# Patient Record
Sex: Female | Born: 1975
Health system: Southern US, Community
[De-identification: ages and names within clinical notes are randomized; demographics above are authoritative.]

## PROBLEM LIST (undated history)

## (undated) DIAGNOSIS — E7800 Pure hypercholesterolemia, unspecified: Secondary | ICD-10-CM

## (undated) DIAGNOSIS — G4730 Sleep apnea, unspecified: Secondary | ICD-10-CM

## (undated) DIAGNOSIS — R011 Cardiac murmur, unspecified: Secondary | ICD-10-CM

## (undated) DIAGNOSIS — T7840XA Allergy, unspecified, initial encounter: Secondary | ICD-10-CM

## (undated) DIAGNOSIS — E538 Deficiency of other specified B group vitamins: Secondary | ICD-10-CM

## (undated) DIAGNOSIS — R87619 Unspecified abnormal cytological findings in specimens from cervix uteri: Secondary | ICD-10-CM

## (undated) DIAGNOSIS — D1800 Hemangioma unspecified site: Secondary | ICD-10-CM

## (undated) DIAGNOSIS — K760 Fatty (change of) liver, not elsewhere classified: Secondary | ICD-10-CM

## (undated) DIAGNOSIS — J309 Allergic rhinitis, unspecified: Secondary | ICD-10-CM

## (undated) DIAGNOSIS — I10 Essential (primary) hypertension: Secondary | ICD-10-CM

## (undated) DIAGNOSIS — M1990 Unspecified osteoarthritis, unspecified site: Secondary | ICD-10-CM

## (undated) DIAGNOSIS — Z87442 Personal history of urinary calculi: Secondary | ICD-10-CM

## (undated) DIAGNOSIS — Z91018 Allergy to other foods: Secondary | ICD-10-CM

## (undated) DIAGNOSIS — J189 Pneumonia, unspecified organism: Secondary | ICD-10-CM

## (undated) DIAGNOSIS — E739 Lactose intolerance, unspecified: Secondary | ICD-10-CM

## (undated) HISTORY — DX: Deficiency of other specified B group vitamins: E53.8

## (undated) HISTORY — DX: Allergic rhinitis, unspecified: J30.9

## (undated) HISTORY — DX: Unspecified abnormal cytological findings in specimens from cervix uteri: R87.619

## (undated) HISTORY — PX: HIP ARTHROSCOPY: SUR88

## (undated) HISTORY — DX: Allergy, unspecified, initial encounter: T78.40XA

## (undated) HISTORY — DX: Essential (primary) hypertension: I10

## (undated) HISTORY — DX: Pure hypercholesterolemia, unspecified: E78.00

## (undated) HISTORY — PX: WISDOM TOOTH EXTRACTION: SHX21

## (undated) HISTORY — DX: Cardiac murmur, unspecified: R01.1

## (undated) HISTORY — DX: Lactose intolerance, unspecified: E73.9

## (undated) HISTORY — DX: Allergy to other foods: Z91.018

## (undated) HISTORY — DX: Sleep apnea, unspecified: G47.30

## (undated) HISTORY — DX: Unspecified osteoarthritis, unspecified site: M19.90

## (undated) HISTORY — DX: Fatty (change of) liver, not elsewhere classified: K76.0

---

## 2013-10-26 ENCOUNTER — Ambulatory Visit (INDEPENDENT_AMBULATORY_CARE_PROVIDER_SITE_OTHER): Payer: BC Managed Care – PPO | Admitting: Internal Medicine

## 2013-10-26 ENCOUNTER — Ambulatory Visit: Payer: Self-pay | Admitting: Family Medicine

## 2013-10-26 ENCOUNTER — Encounter: Payer: Self-pay | Admitting: Internal Medicine

## 2013-10-26 VITALS — BP 138/70 | HR 93 | Temp 99.6°F | Resp 12 | Wt 152.8 lb

## 2013-10-26 DIAGNOSIS — J111 Influenza due to unidentified influenza virus with other respiratory manifestations: Secondary | ICD-10-CM

## 2013-10-26 MED ORDER — OSELTAMIVIR PHOSPHATE 75 MG PO CAPS: 75.0000 mg | ORAL_CAPSULE | Freq: Two times a day (BID) | ORAL | Status: DC

## 2013-10-26 MED ORDER — CLARITHROMYCIN ER 500 MG PO TB24: 1000.0000 mg | ORAL_TABLET | Freq: Every day | ORAL | Status: DC

## 2013-10-26 NOTE — Patient Instructions (Addendum)
Plain Mucinex (NOT D) for thick secretions ;force NON dairy fluids .   Nasal cleansing in the shower as discussed with lather of mild shampoo.After 10 seconds wash off lather while  exhaling through nostrils. Make sure that all residual soap is removed to prevent irritation.  Fluticasone 1 spray in each nostril twice a day as needed. Use the "crossover" technique into opposite nostril spraying toward opposite ear @ 45 degree angle, not straight up into nostril.  Use a Neti pot daily only  as needed for significant sinus congestion; going from open side to congested side . Plain Allegra (NOT D )  160 daily , Loratidine 10 mg , OR Zyrtec 10 mg @ bedtime  as needed for itchy eyes & sneezing. Fill the  prescription for antibiotic it there is not dramatic improvement in the next 72 hours.

## 2013-10-26 NOTE — Progress Notes (Signed)
Pre visit review using our clinic review tool, if applicable. No additional management support is needed unless otherwise documented below in the visit note. 

## 2013-10-26 NOTE — Progress Notes (Signed)
   Subjective:    Patient ID: Jessica Richmond, female    DOB: Apr 27, 1976, 38 y.o.   MRN: [id number]  HPI   Symptoms started 10/24/13 as significant rhinitis, head congestion, nonproductive cough. As of 3/9 she was experiencing itchy, watery eyes, and sneezing. She also had fever, chills, sweats.  She has been exposed to work associates who were ill.  She's been taking nonsteroidal agents.  At this time her significant symptoms include frontal headache, dry cough, fever, chills, sweats, the extrinsic symptoms noted above, and muscle and joint pains.  She does have seasonal allergies but no history of asthma. She's never smoked. No flu shot.       Review of Systems  She specifically denies maxillary sinus pain, dental pain, sore throat, discolored nasal secretions, otic pain, or otic discharge. The cough is not associated with shortness of breath or wheezing.     Objective:   Physical Exam  General appearance:good health ;well nourished; no acute distress or increased work of breathing is present.  No  lymphadenopathy about the head, neck, or axilla noted.   Eyes: No conjunctival inflammation or lid edema is present. There is no scleral icterus.  Ears:  External ear exam shows no significant lesions or deformities. Otoscopic examination reveals clear canals, tympanic membranes are intact bilaterally without bulging, retraction, inflammation or discharge.  Nose:  External nasal examination shows no deformity or inflammation. Nasal mucosa are erythematous without lesions or exudates. R septal  deviation.No obstruction to airflow.   Oral exam: Dental hygiene is good; lips and gums are healthy appearing.There is no oropharyngeal erythema or exudate noted.   Neck:  No deformities, masses, or tenderness noted.   Heart:  Normal rate and regular rhythm. S1 and S2 normal without gallop, murmur, click, rub or other extra sounds.   Lungs:Chest clear to auscultation; no wheezes, rhonchi,rales  ,or rubs present.No increased work of breathing.    Extremities:  No cyanosis, edema, or clubbing  noted    Skin: Warm & dry         Assessment & Plan:  #1 flu/viral respiratory syndrome  Criteria for rhinosinusitis is not present.  See recommendations

## 2013-11-01 ENCOUNTER — Telehealth: Payer: Self-pay | Admitting: Internal Medicine

## 2013-11-01 NOTE — Telephone Encounter (Signed)
Patient Information:  Caller Name: Joei  Phone: [phone]  Patient: Jessica Richmond, Jessica Richmond  Gender: Female  DOB: 1976/04/26  Age: 38 Years  PCP: Unice Cobble  Pregnant: No  Office Follow Up:  Does the office need to follow up with this patient?: No  Instructions For The Office: N/A   Symptoms  Reason For Call & Symptoms: Seen in office over 1 week ago  for flu-like sx and put on Tamiflu. Started with Ear pain on 10/30/13 sx -were mild dizziness, ringing and pain in R ear. Took Advil 600 mgs PO every 6-8 hours and helped with ear pain. Last took Advil at 2200 10/31/13 and today sx better.  Reviewed Health History In EMR: Yes  Reviewed Medications In EMR: Yes  Reviewed Allergies In EMR: Yes  Reviewed Surgeries / Procedures: Yes  Date of Onset of Symptoms: 10/30/2013  Treatments Tried: Advil 600mg s PO  Treatments Tried Worked: No OB / GYN:  LMP: 10/18/2013  Guideline(s) Used:  Earache  Disposition Per Guideline:   Home Care  Reason For Disposition Reached:   Earache < 60 minutes duration that is now completely gone  Advice Given:  Pain Medicines:  For pain relief, you can take either acetaminophen, ibuprofen, or naproxen.  They are over-the-counter (OTC) pain drugs. You can buy them at the drugstore.  Contagiousness:  Ear infections are not contagious.  Call Back If  Earache last more than 1 hour  High fever, severe headache, or stiff neck occurs  You become worse.  Patient Will Follow Care Advice:  YES

## 2013-11-04 ENCOUNTER — Encounter: Payer: Self-pay | Admitting: Family Medicine

## 2013-11-04 ENCOUNTER — Ambulatory Visit (INDEPENDENT_AMBULATORY_CARE_PROVIDER_SITE_OTHER): Payer: BC Managed Care – PPO | Admitting: Family Medicine

## 2013-11-04 ENCOUNTER — Telehealth: Payer: Self-pay | Admitting: Physician Assistant

## 2013-11-04 VITALS — BP 134/90 | HR 74 | Temp 97.7°F | Wt 152.0 lb

## 2013-11-04 DIAGNOSIS — H6640 Suppurative otitis media, unspecified, unspecified ear: Secondary | ICD-10-CM

## 2013-11-04 MED ORDER — FLUCONAZOLE 150 MG PO TABS
150.0000 mg | ORAL_TABLET | Freq: Once | ORAL | Status: DC
Start: 2013-11-04 — End: 2014-03-28

## 2013-11-04 MED ORDER — AZITHROMYCIN 250 MG PO TABS: ORAL_TABLET | ORAL | Status: AC

## 2013-11-04 NOTE — Telephone Encounter (Signed)
Patient Information:  Caller Name: Embrie  Phone: [phone]  Patient: Jessica Richmond, Jessica Richmond  Gender: Female  DOB: September 27, 1975  Age: 38 Years  PCP: Unice Cobble  Pregnant: No  Office Follow Up:  Does the office need to follow up with this patient?: No  Instructions For The Office: N/A  RN Note:  Seen 10/26/13 for influenza.  Took Tamiflu; did not use Rx for Biaxin. Nasal congestion and cough subsided. Ear pain rated 3/10; jaw pain 4-5/10. No appointments remain at [facility] office.  Scheduled for 1630 11/04/13 with Dr Elease Hashimoto at [facility].    Symptoms  Reason For Call & Symptoms: Recurrent right ear ache, jaw pain and dizziness.  Had ear ache 10/29/13 and called for triage 11/01/13 but ear symptoms were resolving so treatment deferred.  Reviewed Health History In EMR: Yes  Reviewed Medications In EMR: Yes  Reviewed Allergies In EMR: Yes  Reviewed Surgeries / Procedures: Yes  Date of Onset of Symptoms: 11/04/2013  Treatments Tried: Advil  Treatments Tried Worked: Yes OB / GYN:  LMP: 10/18/2013  Guideline(s) Used:  Earache  Disposition Per Guideline:   See Today in Office  Reason For Disposition Reached:   All other earaches (Exceptions: earache lasting < 1 hour, and earache from air travel)  Advice Given:  Pain Medicines:  For pain relief, you can take either acetaminophen, ibuprofen, or naproxen.  Ibuprofen (e.g., Motrin, Advil):  Take 400 mg (two 200 mg pills) by mouth every 6 hours.  Another choice is to take 600 mg (three 200 mg pills) by mouth every 8 hours.  The most you should take each day is 1,200 mg (six 200 mg pills), unless your doctor has told you to take more.  Apply Cold to the Area for Pain:  Apply a cold pack or a cold wet washcloth to the outer ear for 20 minutes to reduce pain while the pain medicine takes effect (Note: Some individuals prefer local heat instead of cold for 20 minutes).  Call Back If  High fever, severe headache, or stiff neck occurs  You become  worse.  Patient Will Follow Care Advice:  YES  Appointment Scheduled:  11/04/2013 16:30:00 Appointment Scheduled Provider:  Other

## 2013-11-04 NOTE — Progress Notes (Signed)
Pre visit review using our clinic review tool, if applicable. No additional management support is needed unless otherwise documented below in the visit note. 

## 2013-11-04 NOTE — Progress Notes (Signed)
   Subjective:    Patient ID: Jessica Richmond, female    DOB: 05-23-1976, 38 y.o.   MRN: [id number]  Otalgia  Pertinent negatives include no coughing, ear discharge, headaches or sore throat.   Patient seen a little over week ago with presumed viral syndrome. She had low-grade fever, nasal congestion, cough, body aches. She was seen and prescribed Tamiflu. She now presents with right ear pain for the past week. No decreased hearing. Minimal dizziness off and on. No recurrent fever. No alleviating factors. She was prescribed Biaxin at visit over one week ago but never filled this prescription. Patient is a nonsmoker. She has reported allergy to Keflex with hives.  No past medical history on file. No past surgical history on file.  reports that she has never smoked. She does not have any smokeless tobacco history on file. She reports that she drinks alcohol. She reports that she does not use illicit drugs. family history is not on file. Allergies  Allergen Reactions  . Keflex [Cephalexin] Hives    hives  . Indomethacin Nausea Only    Dizziness      Review of Systems  Constitutional: Negative for fever and chills.  HENT: Positive for ear pain. Negative for ear discharge and sore throat.   Respiratory: Negative for cough.   Neurological: Negative for headaches.       Objective:   Physical Exam  Constitutional: She appears well-developed and well-nourished.  HENT:  Left Ear: External ear normal.  Mouth/Throat: Oropharynx is clear and moist.  Patient has some early suppurative type changes along the inferior portion right eardrum. Minimal erythema.  Neck: Neck supple.  Cardiovascular: Normal rate and regular rhythm.   Pulmonary/Chest: Effort normal and breath sounds normal. No respiratory distress. She has no wheezes. She has no rales.  Lymphadenopathy:    She has no cervical adenopathy.          Assessment & Plan:  Recent viral syndrome. Patient now presents with early  suppurative changes right eardrum. Allergic to Keflex. Start Zithromax for 5 days. Followup if symptoms persist

## 2013-11-04 NOTE — Patient Instructions (Signed)
Otitis Media With Effusion Otitis media with effusion is the presence of fluid in the middle ear. This is a common problem in children, which often follows ear infections. It may be present for weeks or longer after the infection. Unlike an acute ear infection, otitis media with effusion refers only to fluid behind the ear drum and not infection. Children with repeated ear and sinus infections and allergy problems are the most likely to get otitis media with effusion. CAUSES  The most frequent cause of the fluid buildup is dysfunction of the eustachian tubes. These are the tubes that drain fluid in the ears to the to the back of the nose (nasopharynx). SYMPTOMS   The main symptom of this condition is hearing loss. As a result, you or your child may:  Listen to the TV at a loud volume.  Not respond to questions.  Ask "what" often when spoken to.  Mistake or confuse on sound or word for another.  There may be a sensation of fullness or pressure but usually not pain. DIAGNOSIS   Your health care provider will diagnose this condition by examining you or your child's ears.  Your health care provider may test the pressure in you or your child's ear with a tympanometer.  A hearing test may be conducted if the problem persists. TREATMENT   Treatment depends on the duration and the effects of the effusion.  Antibiotics, decongestants, nose drops, and cortisone-type drugs (tablets or nasal spray) may not be helpful.  Children with persistent ear effusions may have delayed language or behavioral problems. Children at risk for developmental delays in hearing, learning, and speech may require referral to a specialist earlier than children not at risk.  You or your child's health care provider may suggest a referral to an ear, nose, and throat surgeon for treatment. The following may help restore normal hearing:  Drainage of fluid.  Placement of ear tubes (tympanostomy tubes).  Removal of  adenoids (adenoidectomy). HOME CARE INSTRUCTIONS   Avoid second hand smoke.  Infants who are breast fed are less likely to have this condition.  Avoid feeding infants while laying flat.  Avoid known environmental allergens.  Avoid people who are sick. SEEK MEDICAL CARE IF:   Hearing is not better in 3 months.  Hearing is worse.  Ear pain.  Drainage from the ear.  Dizziness. MAKE SURE YOU:   Understand these instructions.  Will watch your condition.  Will get help right away if you are not doing well or get worse. Document Released: 09/12/2004 Document Revised: 05/26/2013 Document Reviewed: 03/02/2013 [employer] Patient Information 2014 [employer].  

## 2013-11-26 ENCOUNTER — Ambulatory Visit: Payer: Self-pay | Admitting: Physician Assistant

## 2013-12-06 ENCOUNTER — Ambulatory Visit (INDEPENDENT_AMBULATORY_CARE_PROVIDER_SITE_OTHER): Payer: BC Managed Care – PPO | Admitting: Internal Medicine

## 2013-12-06 ENCOUNTER — Other Ambulatory Visit (INDEPENDENT_AMBULATORY_CARE_PROVIDER_SITE_OTHER): Payer: BC Managed Care – PPO

## 2013-12-06 ENCOUNTER — Encounter: Payer: Self-pay | Admitting: Internal Medicine

## 2013-12-06 VITALS — BP 138/90 | HR 76 | Temp 97.8°F | Resp 16 | Ht 61.0 in | Wt 147.0 lb

## 2013-12-06 DIAGNOSIS — R142 Eructation: Secondary | ICD-10-CM

## 2013-12-06 DIAGNOSIS — R143 Flatulence: Secondary | ICD-10-CM

## 2013-12-06 DIAGNOSIS — R141 Gas pain: Secondary | ICD-10-CM

## 2013-12-06 DIAGNOSIS — J309 Allergic rhinitis, unspecified: Secondary | ICD-10-CM

## 2013-12-06 DIAGNOSIS — Z23 Encounter for immunization: Secondary | ICD-10-CM

## 2013-12-06 DIAGNOSIS — R140 Abdominal distension (gaseous): Secondary | ICD-10-CM

## 2013-12-06 LAB — BASIC METABOLIC PANEL
BUN: 13 mg/dL (ref 6–23)
CALCIUM: 9.4 mg/dL (ref 8.4–10.5)
CHLORIDE: 104 meq/L (ref 96–112)
CO2: 26 mEq/L (ref 19–32)
CREATININE: 0.7 mg/dL (ref 0.4–1.2)
GFR: 98.62 mL/min (ref >60.00)
Glucose, Bld: 90 mg/dL (ref 70–99)
Potassium: 4.1 mEq/L (ref 3.5–5.1)
Sodium: 137 mEq/L (ref 135–145)

## 2013-12-06 LAB — CBC WITH DIFFERENTIAL/PLATELET
Basophils Absolute: 0.1 10*3/uL (ref 0.0–0.1)
Basophils Relative: 1 % (ref 0.0–3.0)
Eosinophils Absolute: 0 10*3/uL (ref 0.0–0.7)
Eosinophils Relative: 0.8 % (ref 0.0–5.0)
HCT: 40 % (ref 36.0–46.0)
Hemoglobin: 13.4 g/dL (ref 12.0–15.0)
LYMPHS PCT: 35.7 % (ref 12.0–46.0)
Lymphs Abs: 2.1 10*3/uL (ref 0.7–4.0)
MCHC: 33.4 g/dL (ref 30.0–36.0)
MCV: 88.1 fl (ref 78.0–100.0)
MONO ABS: 0.2 10*3/uL (ref 0.1–1.0)
Monocytes Relative: 2.8 % — ABNORMAL LOW (ref 3.0–12.0)
NEUTROS PCT: 59.7 % (ref 43.0–77.0)
Neutro Abs: 3.6 10*3/uL (ref 1.4–7.7)
PLATELETS: 285 10*3/uL (ref 150.0–400.0)
RBC: 4.53 Mil/uL (ref 3.87–5.11)
RDW: 12.5 % (ref 11.5–14.6)
WBC: 5.9 10*3/uL (ref 4.5–10.5)

## 2013-12-06 LAB — URINALYSIS
BILIRUBIN URINE: NEGATIVE
KETONES UR: NEGATIVE
LEUKOCYTES UA: NEGATIVE
Nitrite: NEGATIVE
SPECIFIC GRAVITY, URINE: 1.01 (ref 1.000–1.030)
Total Protein, Urine: NEGATIVE
Urine Glucose: NEGATIVE
Urobilinogen, UA: 0.2 (ref 0.0–1.0)
pH: 6 (ref 5.0–8.0)

## 2013-12-06 LAB — TSH: TSH: 1.93 u[IU]/mL (ref 0.35–5.50)

## 2013-12-06 LAB — HEPATIC FUNCTION PANEL
ALT: 22 U/L (ref 0–35)
AST: 29 U/L (ref 0–37)
Albumin: 4 g/dL (ref 3.5–5.2)
Alkaline Phosphatase: 70 U/L (ref 39–117)
BILIRUBIN DIRECT: 0.1 mg/dL (ref 0.0–0.3)
BILIRUBIN TOTAL: 1 mg/dL (ref 0.3–1.2)
Total Protein: 7.3 g/dL (ref 6.0–8.3)

## 2013-12-06 LAB — LIPID PANEL
CHOL/HDL RATIO: 4
CHOLESTEROL: 220 mg/dL — AB (ref 0–200)
HDL: 59.9 mg/dL (ref >39.00)
LDL CALC: 134 mg/dL — AB (ref 0–99)
Triglycerides: 129 mg/dL (ref 0.0–149.0)
VLDL: 25.8 mg/dL (ref 0.0–40.0)

## 2013-12-06 LAB — VITAMIN B12: VITAMIN B 12: 165 pg/mL — AB (ref 211–911)

## 2013-12-06 LAB — H. PYLORI ANTIBODY, IGG: H Pylori IgG: NEGATIVE

## 2013-12-06 MED ORDER — VITAMIN D 1000 UNITS PO TABS: 1000.0000 [IU] | ORAL_TABLET | Freq: Every day | ORAL | Status: AC

## 2013-12-06 NOTE — Assessment & Plan Note (Addendum)
Labs  Claritin qd

## 2013-12-06 NOTE — Assessment & Plan Note (Addendum)
Chronic  Labs Off dairy and gluten Allergy IgG, IgE test

## 2013-12-06 NOTE — Progress Notes (Signed)
Pre visit review using our clinic review tool, if applicable. No additional management support is needed unless otherwise documented below in the visit note. 

## 2013-12-06 NOTE — Patient Instructions (Signed)
Gluten free trial (no wheat products) for 4-6 weeks. OK to use gluten-free bread and gluten-free pasta.     

## 2013-12-06 NOTE — Progress Notes (Signed)
   Subjective:    Patient ID: Jessica Richmond, female    DOB: December 16, 1975, 38 y.o.   MRN: [id number]  Abdominal Pain This is a chronic problem. The current episode started more than 1 year ago. The onset quality is undetermined. The problem occurs every several days. The problem has been unchanged. The pain is located in the generalized abdominal region. The pain is moderate. The quality of the pain is cramping and a sensation of fullness. The abdominal pain does not radiate. Associated symptoms include constipation and nausea. Pertinent negatives include no anorexia, dysuria, fever, frequency, hematochezia, melena, vomiting or weight loss. The pain is aggravated by eating. There is no history of Crohn's disease or ulcerative colitis.   Better off dairy and off gluten C/o allergies  Wt Readings from Last 3 Encounters:  12/06/13 147 lb (66.679 kg)  11/04/13 152 lb (68.947 kg)  10/26/13 152 lb 12.8 oz (69.31 kg)     Review of Systems  Constitutional: Negative for fever and weight loss.  HENT: Positive for postnasal drip, rhinorrhea and sneezing.   Gastrointestinal: Positive for nausea, abdominal pain and constipation. Negative for vomiting, melena, hematochezia and anorexia.  Genitourinary: Negative for dysuria and frequency.       Objective:   Physical Exam  Constitutional: She appears well-developed. No distress.  HENT:  Head: Normocephalic.  Right Ear: External ear normal.  Left Ear: External ear normal.  Nose: Nose normal.  Mouth/Throat: Oropharynx is clear and moist.  Eyes: Conjunctivae are normal. Pupils are equal, round, and reactive to light. Right eye exhibits no discharge. Left eye exhibits no discharge.  Neck: Normal range of motion. Neck supple. No JVD present. No tracheal deviation present. No thyromegaly present.  Cardiovascular: Normal rate, regular rhythm and normal heart sounds.   Pulmonary/Chest: No stridor. No respiratory distress. She has no wheezes.  Abdominal:  Soft. Bowel sounds are normal. She exhibits no distension and no mass. There is no tenderness. There is no rebound and no guarding.  Musculoskeletal: She exhibits no edema and no tenderness.  Lymphadenopathy:    She has no cervical adenopathy.  Neurological: She displays normal reflexes. No cranial nerve deficit. She exhibits normal muscle tone. Coordination normal.  Skin: No rash noted. No erythema.  Psychiatric: She has a normal mood and affect. Her behavior is normal. Judgment and thought content normal.    Lab Results  Component Value Date   WBC 5.9 12/06/2013   HGB 13.4 12/06/2013   HCT 40.0 12/06/2013   PLT 285.0 12/06/2013   GLUCOSE 90 12/06/2013   CHOL 220* 12/06/2013   TRIG 129.0 12/06/2013   HDL 59.90 12/06/2013   LDLCALC 134* 12/06/2013   ALT 22 12/06/2013   AST 29 12/06/2013   NA 137 12/06/2013   K 4.1 12/06/2013   CL 104 12/06/2013   CREATININE 0.7 12/06/2013   BUN 13 12/06/2013   CO2 26 12/06/2013   TSH 1.93 12/06/2013         Assessment & Plan:

## 2013-12-07 LAB — ALLERGY PROFILE REGION II-DC, DE, MD, ~~LOC~~, VA
ALLERGEN, D PTERNOYSSINUS, D1: 0.18 kU/L — AB
Alternaria Alternata: 0.11 kU/L — ABNORMAL HIGH
Bermuda Grass: <0.1 kU/L
Box Elder IgE: <0.1 kU/L
CAT DANDER: 0.17 kU/L — AB
Cockroach: 0.76 kU/L — ABNORMAL HIGH
Common Ragweed: 0.47 kU/L — ABNORMAL HIGH
D. FARINAE: 0.13 kU/L — AB
Dog Dander: <0.1 kU/L
Johnson Grass: <0.1 kU/L
Lamb's Quarters: <0.1 kU/L
Meadow Grass: <0.1 kU/L
Pecan/Hickory Tree IgE: <0.1 kU/L

## 2013-12-07 LAB — ALLERGEN FOOD PROFILE SPECIFIC IGE
Apple: <0.1 kU/L
Chicken IgE: <0.1 kU/L
Egg White IgE: <0.1 kU/L
Fish Cod: <0.1 kU/L
IgE (Immunoglobulin E), Serum: 35.4 IU/mL (ref 0.0–180.0)
Milk IgE: <0.1 kU/L
Orange: <0.1 kU/L
Peanut IgE: <0.1 kU/L
Shrimp IgE: 0.88 kU/L — ABNORMAL HIGH
Tomato IgE: <0.1 kU/L
Tuna IgE: <0.1 kU/L

## 2013-12-07 LAB — RETICULIN ANTIBODIES, IGA W TITER: RETICULIN AB, IGA: NEGATIVE

## 2013-12-10 LAB — IGG FOOD PANEL
ALLERGEN CORN IGG: 0.18 ug/mL — AB (ref <0.15)
Allergen, Milk, IgG: 14.4 ug/mL — ABNORMAL HIGH (ref <0.15)
Beef, IgG: 7.1 ug/mL — ABNORMAL HIGH (ref <2.0)
Chicken, IgG: <0.15 ug/mL (ref <0.15)
Egg white, IgG: 3.7 ug/mL — ABNORMAL HIGH (ref <2.0)
Egg yolk, IgG: 2.3 ug/mL — ABNORMAL HIGH (ref <2.0)
Peanut, IgG: 0.27 ug/mL — ABNORMAL HIGH (ref <0.15)
Wheat, IgG: 0.71 ug/mL — ABNORMAL HIGH (ref <0.15)

## 2013-12-14 LAB — GLIADIN ANTIBODIES, SERUM: GLIADIN IGG: 21.1 U/mL — AB (ref <20)

## 2013-12-14 LAB — TISSUE TRANSGLUTAMINASE, IGA: TISSUE TRANSGLUTAMINASE AB, IGA: 5.8 U/mL (ref <20)

## 2013-12-27 ENCOUNTER — Ambulatory Visit (INDEPENDENT_AMBULATORY_CARE_PROVIDER_SITE_OTHER): Payer: BC Managed Care – PPO | Admitting: Internal Medicine

## 2013-12-27 ENCOUNTER — Encounter: Payer: Self-pay | Admitting: Internal Medicine

## 2013-12-27 VITALS — BP 136/88 | HR 79 | Temp 98.2°F | Ht 61.0 in | Wt 146.8 lb

## 2013-12-27 DIAGNOSIS — J309 Allergic rhinitis, unspecified: Secondary | ICD-10-CM

## 2013-12-27 DIAGNOSIS — R141 Gas pain: Secondary | ICD-10-CM

## 2013-12-27 DIAGNOSIS — R142 Eructation: Secondary | ICD-10-CM

## 2013-12-27 DIAGNOSIS — R140 Abdominal distension (gaseous): Secondary | ICD-10-CM

## 2013-12-27 DIAGNOSIS — E538 Deficiency of other specified B group vitamins: Secondary | ICD-10-CM | POA: Insufficient documentation

## 2013-12-27 DIAGNOSIS — R143 Flatulence: Secondary | ICD-10-CM

## 2013-12-27 MED ORDER — CYANOCOBALAMIN 1000 MCG/ML IJ SOLN
1000.0000 ug | Freq: Once | INTRAMUSCULAR | Status: AC
  Administered 2013-12-27: 1000 ug via INTRAMUSCULAR

## 2013-12-27 MED ORDER — CYANOCOBALAMIN 2000 MCG PO TABS: 2000.0000 ug | ORAL_TABLET | Freq: Every day | ORAL | Status: DC

## 2013-12-27 NOTE — Progress Notes (Signed)
Pre visit review using our clinic review tool, if applicable. No additional management support is needed unless otherwise documented below in the visit note. 

## 2013-12-27 NOTE — Assessment & Plan Note (Signed)
2015 Start Rx GI ref Dr Henrene Pastor

## 2014-01-03 ENCOUNTER — Encounter: Payer: Self-pay | Admitting: Internal Medicine

## 2014-01-03 NOTE — Assessment & Plan Note (Signed)
Allergies discussed Claritin daily

## 2014-01-03 NOTE — Progress Notes (Signed)
   Subjective:   F/u Vit B12 def  Abdominal Pain This is a chronic problem. The current episode started more than 1 year ago. The onset quality is undetermined. The problem occurs every several days. The problem has been unchanged. The pain is located in the generalized abdominal region. The pain is moderate. The quality of the pain is cramping and a sensation of fullness. The abdominal pain does not radiate. Associated symptoms include constipation and nausea. Pertinent negatives include no anorexia, dysuria, fever, frequency, hematochezia, melena, vomiting or weight loss. The pain is aggravated by eating. There is no history of Crohn's disease or ulcerative colitis.   Better off dairy and off gluten F/u allergies  Wt Readings from Last 3 Encounters:  12/27/13 146 lb 12 oz (66.565 kg)  12/06/13 147 lb (66.679 kg)  11/04/13 152 lb (68.947 kg)     Review of Systems  Constitutional: Negative for fever and weight loss.  HENT: Positive for postnasal drip, rhinorrhea and sneezing.   Gastrointestinal: Positive for nausea, abdominal pain and constipation. Negative for vomiting, melena, hematochezia and anorexia.  Genitourinary: Negative for dysuria and frequency.       Objective:   Physical Exam  Constitutional: She appears well-developed. No distress.  HENT:  Head: Normocephalic.  Right Ear: External ear normal.  Left Ear: External ear normal.  Nose: Nose normal.  Mouth/Throat: Oropharynx is clear and moist.  Eyes: Conjunctivae are normal. Pupils are equal, round, and reactive to light. Right eye exhibits no discharge. Left eye exhibits no discharge.  Neck: Normal range of motion. Neck supple. No JVD present. No tracheal deviation present. No thyromegaly present.  Cardiovascular: Normal rate, regular rhythm and normal heart sounds.   Pulmonary/Chest: No stridor. No respiratory distress. She has no wheezes.  Abdominal: Soft. Bowel sounds are normal. She exhibits no distension and no  mass. There is no tenderness. There is no rebound and no guarding.  Musculoskeletal: She exhibits no edema and no tenderness.  Lymphadenopathy:    She has no cervical adenopathy.  Neurological: She displays normal reflexes. No cranial nerve deficit. She exhibits normal muscle tone. Coordination normal.  Skin: No rash noted. No erythema.  Psychiatric: She has a normal mood and affect. Her behavior is normal. Judgment and thought content normal.    Lab Results  Component Value Date   WBC 5.9 12/06/2013   HGB 13.4 12/06/2013   HCT 40.0 12/06/2013   PLT 285.0 12/06/2013   GLUCOSE 90 12/06/2013   CHOL 220* 12/06/2013   TRIG 129.0 12/06/2013   HDL 59.90 12/06/2013   LDLCALC 134* 12/06/2013   ALT 22 12/06/2013   AST 29 12/06/2013   NA 137 12/06/2013   K 4.1 12/06/2013   CL 104 12/06/2013   CREATININE 0.7 12/06/2013   BUN 13 12/06/2013   CO2 26 12/06/2013   TSH 1.93 12/06/2013         Assessment & Plan:

## 2014-01-03 NOTE — Assessment & Plan Note (Signed)
Chronic  Off dairy and gluten

## 2014-03-07 ENCOUNTER — Ambulatory Visit (INDEPENDENT_AMBULATORY_CARE_PROVIDER_SITE_OTHER): Payer: BC Managed Care – PPO | Admitting: Internal Medicine

## 2014-03-07 ENCOUNTER — Encounter: Payer: Self-pay | Admitting: Internal Medicine

## 2014-03-07 ENCOUNTER — Ambulatory Visit: Payer: BC Managed Care – PPO | Admitting: Internal Medicine

## 2014-03-07 VITALS — BP 124/90 | HR 92 | Ht 61.5 in | Wt 150.5 lb

## 2014-03-07 DIAGNOSIS — R1084 Generalized abdominal pain: Secondary | ICD-10-CM

## 2014-03-07 DIAGNOSIS — R142 Eructation: Secondary | ICD-10-CM

## 2014-03-07 DIAGNOSIS — E538 Deficiency of other specified B group vitamins: Secondary | ICD-10-CM

## 2014-03-07 DIAGNOSIS — R141 Gas pain: Secondary | ICD-10-CM

## 2014-03-07 DIAGNOSIS — K5900 Constipation, unspecified: Secondary | ICD-10-CM

## 2014-03-07 DIAGNOSIS — R143 Flatulence: Secondary | ICD-10-CM

## 2014-03-07 MED ORDER — MOVIPREP 100 G PO SOLR: 1.0000 | Freq: Once | ORAL | Status: DC

## 2014-03-07 NOTE — Progress Notes (Signed)
HISTORY OF PRESENT ILLNESS:  Jessica Richmond is a 38 y.o. female , new to [place], who is referred regarding chronic abdominal complaints and B12 deficiency. Patient has had long-standing abdominal complaints which she attributed to lactose intolerance. Over the past 6-7 months she has had significant problems with postprandial bloating, occasional abdominal pain, and a tendency toward constipation (not requiring laxatives). There has been no fevers, bleeding, or weight loss. Actually, slight weight gain. She mentions fatigue. She was evaluated by Dr. Alain Marion a few months ago. Blood work revealed B12 deficiency. CBC was normal as was TSH and comprehensive metabolic panel. Gliadin IgG antibody was positive with negative tissue transglutaminase antibody. Food allergy panel was normal except for reactive the with shrimp IgE. She reports that her sister may have had some form of "colitis" but has no details. Father with diverticulosis. No GI malignancy. Patient denies rash. No joint aches. She is on sublingual B12 replacement  REVIEW OF SYSTEMS:  All non-GI ROS negative except for sinus and allergy, fatigue  Past Medical History  Diagnosis Date  . Vitamin B 12 deficiency   . Allergic rhinitis   . Multiple food allergies     wheat, Milk, shrimp    Past Surgical History  Procedure Laterality Date  . Hip arthroscopy Left     torn labrumin    Social History Ceriah Tinch  reports that she has never smoked. She has never used smokeless tobacco. She reports that she drinks alcohol. She reports that she does not use illicit drugs.  family history includes Breast cancer in her mother; Crohn's disease in her sister; Diabetes in her father; Diverticulosis in her father; Heart disease (age of onset: 75) in her mother; Hypertension in her father; Uterine cancer in her other.  Allergies  Allergen Reactions  . Keflex [Cephalexin] Hives    hives  . Indomethacin Nausea Only    Dizziness  .  Milk-Related Compounds   . Shrimp [Shellfish Allergy]   . Wheat Bran        PHYSICAL EXAMINATION: Vital signs: BP 124/90  Pulse 92  Ht 5' 1.5" (1.562 m)  Wt 150 lb 8 oz (68.266 kg)  BMI 27.98 kg/m2  LMP 02/21/2014  Constitutional: generally well-appearing, no acute distress Psychiatric: alert and oriented x3, cooperative Eyes: extraocular movements intact, anicteric, conjunctiva pink Mouth: oral pharynx moist, no lesions Neck: supple no lymphadenopathy Cardiovascular: heart regular rate and rhythm, no murmur Lungs: clear to auscultation bilaterally Abdomen: soft, nontender, nondistended, no obvious ascites, no peritoneal signs, normal bowel sounds, no organomegaly Rectal: Deferred until colonoscopy Extremities: no lower extremity edema bilaterally Skin: no lesions on visible extremities Neuro: No focal deficits. No asterixis.    ASSESSMENT:  #1. Chronic abdominal complaints and B12 deficiency. Now on B12 replacement. Equivocal celiac testing. Rule out antibodies to intrinsic factor, occult duodenal or ileal disease.   PLAN:  #1. Anti-intrinsic factor antibodies. To be obtained with followup blood work in a few weeks #2. Upper endoscopy with duodenal biopsies and colonoscopy with ileal intubation.The nature of the procedure, as well as the risks, benefits, and alternatives were carefully and thoroughly reviewed with the patient. Ample time for discussion and questions allowed. The patient understood, was satisfied, and agreed to proceed. Movi prep prescribed. The patient instructed on its use #3. If the above negative, consider an empiric course of broad-spectrum antibiotics for possible bacterial overgrowth

## 2014-03-07 NOTE — Patient Instructions (Signed)
  I added an anti intrinsic antibody lab to your existing lab order for next week.  You have been scheduled for an endoscopy and colonoscopy. Please follow the written instructions given to you at your visit today. Please pick up your prep at the pharmacy within the next 1-3 days. If you use inhalers (even only as needed), please bring them with you on the day of your procedure. Your physician has requested that you go to www.startemmi.com and enter the access code given to you at your visit today. This web site gives a general overview about your procedure. However, you should still follow specific instructions given to you by our office regarding your preparation for the procedure.

## 2014-03-08 ENCOUNTER — Encounter: Payer: Self-pay | Admitting: Internal Medicine

## 2014-03-22 ENCOUNTER — Other Ambulatory Visit (INDEPENDENT_AMBULATORY_CARE_PROVIDER_SITE_OTHER): Payer: BC Managed Care – PPO

## 2014-03-22 ENCOUNTER — Other Ambulatory Visit: Payer: BC Managed Care – PPO

## 2014-03-22 DIAGNOSIS — E538 Deficiency of other specified B group vitamins: Secondary | ICD-10-CM

## 2014-03-22 DIAGNOSIS — K5900 Constipation, unspecified: Secondary | ICD-10-CM

## 2014-03-22 DIAGNOSIS — R143 Flatulence: Secondary | ICD-10-CM

## 2014-03-22 DIAGNOSIS — R141 Gas pain: Secondary | ICD-10-CM

## 2014-03-22 DIAGNOSIS — R142 Eructation: Secondary | ICD-10-CM

## 2014-03-22 DIAGNOSIS — R1084 Generalized abdominal pain: Secondary | ICD-10-CM

## 2014-03-22 LAB — VITAMIN B12: Vitamin B-12: 1298 pg/mL — ABNORMAL HIGH (ref 211–911)

## 2014-03-23 LAB — INTRINSIC FACTOR ANTIBODIES: INTRINSIC FACTOR: NEGATIVE

## 2014-03-23 LAB — VITAMIN D 25 HYDROXY (VIT D DEFICIENCY, FRACTURES): VIT D 25 HYDROXY: 56 ng/mL (ref 30–89)

## 2014-03-28 ENCOUNTER — Encounter: Payer: Self-pay | Admitting: Internal Medicine

## 2014-03-28 ENCOUNTER — Ambulatory Visit (INDEPENDENT_AMBULATORY_CARE_PROVIDER_SITE_OTHER): Payer: BC Managed Care – PPO | Admitting: Internal Medicine

## 2014-03-28 VITALS — BP 147/88 | HR 89 | Temp 97.6°F | Wt 153.0 lb

## 2014-03-28 DIAGNOSIS — J309 Allergic rhinitis, unspecified: Secondary | ICD-10-CM

## 2014-03-28 DIAGNOSIS — R142 Eructation: Secondary | ICD-10-CM

## 2014-03-28 DIAGNOSIS — R030 Elevated blood-pressure reading, without diagnosis of hypertension: Secondary | ICD-10-CM | POA: Insufficient documentation

## 2014-03-28 DIAGNOSIS — R141 Gas pain: Secondary | ICD-10-CM

## 2014-03-28 DIAGNOSIS — IMO0001 Reserved for inherently not codable concepts without codable children: Secondary | ICD-10-CM

## 2014-03-28 DIAGNOSIS — R143 Flatulence: Secondary | ICD-10-CM

## 2014-03-28 DIAGNOSIS — R140 Abdominal distension (gaseous): Secondary | ICD-10-CM

## 2014-03-28 DIAGNOSIS — E538 Deficiency of other specified B group vitamins: Secondary | ICD-10-CM

## 2014-03-28 NOTE — Assessment & Plan Note (Signed)
Continue with current prescription therapy as reflected on the Med list.  

## 2014-03-28 NOTE — Progress Notes (Signed)
   Subjective:   F/u Vit B12 def, elev BP due to stress   Abdominal Pain This is a chronic problem. The current episode started more than 1 year ago. The onset quality is undetermined. The problem occurs every several days. The problem has been gradually improving. The pain is located in the generalized abdominal region. The pain is mild. The quality of the pain is cramping and a sensation of fullness. The abdominal pain does not radiate. Associated symptoms include constipation and nausea. Pertinent negatives include no anorexia, dysuria, fever, frequency, hematochezia, melena, vomiting or weight loss. The pain is aggravated by eating. There is no history of Crohn's disease or ulcerative colitis.   Better off dairy and off gluten F/u allergies  Wt Readings from Last 3 Encounters:  03/28/14 153 lb (69.4 kg)  03/07/14 150 lb 8 oz (68.266 kg)  12/27/13 146 lb 12 oz (66.565 kg)     Review of Systems  Constitutional: Negative for fever and weight loss.  HENT: Positive for postnasal drip, rhinorrhea and sneezing.   Gastrointestinal: Positive for nausea, abdominal pain and constipation. Negative for vomiting, melena, hematochezia and anorexia.  Genitourinary: Negative for dysuria and frequency.       Objective:   Physical Exam  Constitutional: She appears well-developed. No distress.  HENT:  Head: Normocephalic.  Right Ear: External ear normal.  Left Ear: External ear normal.  Nose: Nose normal.  Mouth/Throat: Oropharynx is clear and moist.  Eyes: Conjunctivae are normal. Pupils are equal, round, and reactive to light. Right eye exhibits no discharge. Left eye exhibits no discharge.  Neck: Normal range of motion. Neck supple. No JVD present. No tracheal deviation present. No thyromegaly present.  Cardiovascular: Normal rate, regular rhythm and normal heart sounds.   Pulmonary/Chest: No stridor. No respiratory distress. She has no wheezes.  Abdominal: Soft. Bowel sounds are normal.  She exhibits no distension and no mass. There is no tenderness. There is no rebound and no guarding.  Musculoskeletal: She exhibits no edema and no tenderness.  Lymphadenopathy:    She has no cervical adenopathy.  Neurological: She displays normal reflexes. No cranial nerve deficit. She exhibits normal muscle tone. Coordination normal.  Skin: No rash noted. No erythema.  Psychiatric: She has a normal mood and affect. Her behavior is normal. Judgment and thought content normal.    Lab Results  Component Value Date   WBC 5.9 12/06/2013   HGB 13.4 12/06/2013   HCT 40.0 12/06/2013   PLT 285.0 12/06/2013   GLUCOSE 90 12/06/2013   CHOL 220* 12/06/2013   TRIG 129.0 12/06/2013   HDL 59.90 12/06/2013   LDLCALC 134* 12/06/2013   ALT 22 12/06/2013   AST 29 12/06/2013   NA 137 12/06/2013   K 4.1 12/06/2013   CL 104 12/06/2013   CREATININE 0.7 12/06/2013   BUN 13 12/06/2013   CO2 26 12/06/2013   TSH 1.93 12/06/2013         Assessment & Plan:

## 2014-03-28 NOTE — Assessment & Plan Note (Signed)
Monitor BP 

## 2014-03-28 NOTE — Assessment & Plan Note (Addendum)
Change from 2000 mcg/d to 1000 mcg B12/d sl

## 2014-03-28 NOTE — Progress Notes (Deleted)
Pre visit review using our clinic review tool, if applicable. No additional management support is needed unless otherwise documented below in the visit note. 

## 2014-05-10 ENCOUNTER — Encounter: Payer: BC Managed Care – PPO | Admitting: Internal Medicine

## 2014-05-26 ENCOUNTER — Encounter: Payer: Self-pay | Admitting: Obstetrics & Gynecology

## 2014-05-26 ENCOUNTER — Ambulatory Visit (INDEPENDENT_AMBULATORY_CARE_PROVIDER_SITE_OTHER): Payer: BC Managed Care – PPO | Admitting: Obstetrics & Gynecology

## 2014-05-26 VITALS — BP 135/84 | HR 84 | Resp 16 | Ht 61.5 in | Wt 150.0 lb

## 2014-05-26 DIAGNOSIS — Z124 Encounter for screening for malignant neoplasm of cervix: Secondary | ICD-10-CM

## 2014-05-26 DIAGNOSIS — Z1151 Encounter for screening for human papillomavirus (HPV): Secondary | ICD-10-CM | POA: Diagnosis not present

## 2014-05-26 DIAGNOSIS — Z01419 Encounter for gynecological examination (general) (routine) without abnormal findings: Secondary | ICD-10-CM | POA: Diagnosis not present

## 2014-05-26 MED ORDER — NORGESTIMATE-ETH ESTRADIOL 0.25-35 MG-MCG PO TABS
1.0000 | ORAL_TABLET | Freq: Every day | ORAL | Status: DC
Start: 2014-05-26 — End: 2015-06-05

## 2014-05-26 NOTE — Progress Notes (Signed)
  Subjective:     Jessica Richmond is a 38 y.o. female here for a routine exam.  Current complaints: none.  Pt is on OCPs and has one menses a month.  Would like to continue.  Personal health questionnaire reviewed: yes.  Pt recently moved to area.  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 Contraception: OCP (estrogen/progesterone) Last Pap: history of ASCUS 15+ years ago, all normal since Last mammogram: never has had a mammogram.  Obstetric History OB History  Gravida Para Term Preterm AB SAB TAB Ectopic Multiple Living  _0         The following portions of the patient's history were reviewed and updated as appropriate: allergies, current medications, past family history, past medical history, past social history, past surgical history and problem list.  Review of Systems Pertinent items are noted in HPI.    Objective:      Filed Vitals:   05/26/14 1323  BP: 135/84  Pulse: 84  Resp: 16  Height: 5' 1.5" (1.562 m)  Weight: 150 lb (68.04 kg)   Vitals:  WNL General appearance: alert, cooperative and no distress Head: Normocephalic, without obvious abnormality, atraumatic Eyes: negative Throat: lips, mucosa, and tongue normal; teeth and gums normal Lungs: clear to auscultation bilaterally Breasts: normal appearance, no masses or tenderness, No nipple retraction or dimpling, No nipple discharge or bleeding Heart: regular rate and rhythm Abdomen: soft, non-tender; bowel sounds normal; no masses,  no organomegaly  Pelvic:  External Genitalia:  Tanner V, no lesion Urethra:  No prolapse Vagina:  Pink, normal rugae, no blood or discharge Cervix:  No CMT, no lesion Uterus:  Normal size and contour, non tender Adnexa:  Normal, no masses, non tender  Extremities: no edema, redness or tenderness in the calves or thighs Skin: no lesions or rash Lymph nodes: Axillary  adenopathy: none        Assessment:    Healthy female exam.    Plan:    Education reviewed: self breast exams and skin cancer screening. Contraception: OCP (estrogen/progesterone). Mammogram ordered. Follow up in: 1 year. Mother has history of postmenopausal breast cancer.  No other relatives with BRCA type cancers.

## 2014-05-31 LAB — CYTOLOGY - PAP

## 2014-06-08 ENCOUNTER — Ambulatory Visit (INDEPENDENT_AMBULATORY_CARE_PROVIDER_SITE_OTHER): Payer: BC Managed Care – PPO

## 2014-06-08 DIAGNOSIS — Z01419 Encounter for gynecological examination (general) (routine) without abnormal findings: Secondary | ICD-10-CM

## 2014-06-08 DIAGNOSIS — Z1231 Encounter for screening mammogram for malignant neoplasm of breast: Secondary | ICD-10-CM

## 2014-07-07 ENCOUNTER — Encounter: Payer: Self-pay | Admitting: Family

## 2014-07-07 ENCOUNTER — Ambulatory Visit (INDEPENDENT_AMBULATORY_CARE_PROVIDER_SITE_OTHER): Payer: BC Managed Care – PPO | Admitting: Family

## 2014-07-07 ENCOUNTER — Telehealth: Payer: Self-pay | Admitting: Internal Medicine

## 2014-07-07 VITALS — BP 140/86 | HR 75 | Temp 98.3°F | Resp 18 | Ht 61.0 in | Wt 155.8 lb

## 2014-07-07 DIAGNOSIS — N3090 Cystitis, unspecified without hematuria: Secondary | ICD-10-CM | POA: Insufficient documentation

## 2014-07-07 DIAGNOSIS — R300 Dysuria: Secondary | ICD-10-CM

## 2014-07-07 LAB — POCT URINALYSIS DIPSTICK
Bilirubin, UA: NEGATIVE
Blood, UA: NEGATIVE
Glucose, UA: NEGATIVE
Ketones, UA: NEGATIVE
Nitrite, UA: NEGATIVE
PH UA: 6
PROTEIN UA: NEGATIVE
Spec Grav, UA: 1.015
Urobilinogen, UA: NEGATIVE

## 2014-07-07 MED ORDER — SULFAMETHOXAZOLE-TRIMETHOPRIM 800-160 MG PO TABS: 1.0000 | ORAL_TABLET | Freq: Two times a day (BID) | ORAL | Status: DC

## 2014-07-07 NOTE — Patient Instructions (Signed)
Thank you for choosing [facility].  Summary/Instructions:  Your prescription(s) have been submitted to your pharmacy. Please take as directed and contact our office if you believe you are having problem(s) with the medication(s).  If your symptoms worsen or fail to improve, please contact our office for further instruction, or in case of emergency go directly to the emergency room at the closest medical facility.   Urinary Tract Infection Urinary tract infections (UTIs) can develop anywhere along your urinary tract. Your urinary tract is your body's drainage system for removing wastes and extra water. Your urinary tract includes two kidneys, two ureters, a bladder, and a urethra. Your kidneys are a pair of bean-shaped organs. Each kidney is about the size of your fist. They are located below your ribs, one on each side of your spine. CAUSES Infections are caused by microbes, which are microscopic organisms, including fungi, viruses, and bacteria. These organisms are so small that they can only be seen through a microscope. Bacteria are the microbes that most commonly cause UTIs. SYMPTOMS  Symptoms of UTIs may vary by age and gender of the patient and by the location of the infection. Symptoms in young women typically include a frequent and intense urge to urinate and a painful, burning feeling in the bladder or urethra during urination. Older women and men are more likely to be tired, shaky, and weak and have muscle aches and abdominal pain. A fever may mean the infection is in your kidneys. Other symptoms of a kidney infection include pain in your back or sides below the ribs, nausea, and vomiting. DIAGNOSIS To diagnose a UTI, your caregiver will ask you about your symptoms. Your caregiver also will ask to provide a urine sample. The urine sample will be tested for bacteria and white blood cells. White blood cells are made by your body to help fight infection. TREATMENT  Typically, UTIs can  be treated with medication. Because most UTIs are caused by a bacterial infection, they usually can be treated with the use of antibiotics. The choice of antibiotic and length of treatment depend on your symptoms and the type of bacteria causing your infection. HOME CARE INSTRUCTIONS  If you were prescribed antibiotics, take them exactly as your caregiver instructs you. Finish the medication even if you feel better after you have only taken some of the medication.  Drink enough water and fluids to keep your urine clear or pale yellow.  Avoid caffeine, tea, and carbonated beverages. They tend to irritate your bladder.  Empty your bladder often. Avoid holding urine for long periods of time.  Empty your bladder before and after sexual intercourse.  After a bowel movement, women should cleanse from front to back. Use each tissue only once. SEEK MEDICAL CARE IF:   You have back pain.  You develop a fever.  Your symptoms do not begin to resolve within 3 days. SEEK IMMEDIATE MEDICAL CARE IF:   You have severe back pain or lower abdominal pain.  You develop chills.  You have nausea or vomiting.  You have continued burning or discomfort with urination. MAKE SURE YOU:   Understand these instructions.  Will watch your condition.  Will get help right away if you are not doing well or get worse. Document Released: 05/15/2005 Document Revised: 02/04/2012 Document Reviewed: 09/13/2011 ExitCare Patient Information 2015 [employer]. This information is not intended to replace advice given to you by your health care provider. Make sure you discuss any questions you have with your health   care provider.   

## 2014-07-07 NOTE — Progress Notes (Signed)
   Subjective:    Patient ID: Jessica Richmond, female    DOB: 1976/06/20, 38 y.o.   MRN: [id number]  Chief Complaint  Patient presents with  . Dysuria    x5 days on and off with urgency    HPI:  Jessica Richmond is a 38 y.o. female who presents today for an acute visit.   Acute symptoms of painful urination started about 5 days ago. Dysuria has been waxing and waning. Urgency and burning, but not consistently. Denies fevers or chills or back pain. Denies any antibiotic use recently.   Allergies  Allergen Reactions  . Keflex [Cephalexin] Hives    hives  . Indomethacin Nausea Only    Dizziness  . Milk-Related Compounds   . Shrimp [Shellfish Allergy]   . Wheat Bran    Current Outpatient Prescriptions on File Prior to Visit  Medication Sig Dispense Refill  . cholecalciferol (VITAMIN D) 1000 UNITS tablet Take 1 tablet (1,000 Units total) by mouth daily. 100 tablet 3  . cyanocobalamin ([facility] VITAMIN B12) 2000 MCG tablet Take 1 tablet (2,000 mcg total) by mouth daily. 100 tablet 3  . EPIPEN 2-PAK 0.3 MG/0.3ML SOAJ injection     . fluticasone (FLONASE) 50 MCG/ACT nasal spray Place 1 spray into both nostrils daily.    Marland Kitchen loratadine (CLARITIN) 10 MG tablet Take 10 mg by mouth daily.    . norgestimate-ethinyl estradiol (ORTHO-CYCLEN, 28,) 0.25-35 MG-MCG tablet Take 1 tablet by mouth daily. 1 Package 12   No current facility-administered medications on file prior to visit.    Review of Systems    See HPI  Objective:    BP 140/86 mmHg  Pulse 75  Temp(Src) 98.3 F (36.8 C) (Oral)  Resp 18  Ht 5\' 1"  (1.549 m)  Wt 155 lb 12.8 oz (70.67 kg)  BMI 29.45 kg/m2  SpO2 99% Nursing note and vital signs reviewed.  Physical Exam  Constitutional: She is oriented to person, place, and time. She appears well-developed and well-nourished. No distress.  Cardiovascular: Normal rate, regular rhythm, normal heart sounds and intact distal pulses.   Pulmonary/Chest: Effort normal and breath sounds  normal.  Abdominal: There is no CVA tenderness.  Neurological: She is alert and oriented to person, place, and time.  Skin: Skin is warm and dry.  Psychiatric: She has a normal mood and affect. Her behavior is normal. Judgment and thought content normal.       Assessment & Plan:

## 2014-07-07 NOTE — Progress Notes (Signed)
Pre visit review using our clinic review tool, if applicable. No additional management support is needed unless otherwise documented below in the visit note. 

## 2014-07-07 NOTE — Assessment & Plan Note (Addendum)
Symptoms consistent with cystitis. POCT UA is positive for leukocytes, negative for nitrites. Start Bactrim x 3 days. Drink plenty of fluids. Follow up if symptoms worsen or fail to improve.

## 2014-07-07 NOTE — Telephone Encounter (Signed)
Patient Information:  Caller Name: Rossi  Phone: [phone]  Patient: Jessica Richmond, Jessica Richmond  Gender: Female  DOB: 1976-03-17  Age: 38 Years  PCP: Plotnikov, Alex (Adults only)  Pregnant: No  Office Follow Up:  Does the office need to follow up with this patient?: No  Instructions For The Office: N/A   Symptoms  Reason For Call & Symptoms: 07/02/14 took Azo due to urinary urgency and burning which helped.    07/07/14 urinary burning and urgency have returned.   No blood in urine.   Increased fluids.  Rates pain with urination 4/10.   Afebrile.  Reviewed Health History In EMR: Yes  Reviewed Medications In EMR: Yes  Reviewed Allergies In EMR: Yes  Reviewed Surgeries / Procedures: Yes  Date of Onset of Symptoms: 07/02/2014 OB / GYN:  LMP: 06/16/2014  Guideline(s) Used:  Urination Pain - Female  Disposition Per Guideline:   See Today in Office  Reason For Disposition Reached:   Painful urination AND EITHER frequency or urgency  Advice Given:  Fluids:   Drink extra fluids. Drink 8-10 glasses of liquids a day (Reason: to produce a dilute, non-irritating urine).  Warm Saline SITZ Baths to Reduce Pain:  Sit in a warm saline bath for 20 minutes to cleanse the area and to reduce pain. Add 2 oz. of table salt or baking soda to a tub of water.  Call Back If:  You become worse.  Patient Will Follow Care Advice:  YES  Appointment Scheduled:  07/07/2014 14:15:00 Appointment Scheduled Provider:  Mauricio Po

## 2015-06-05 ENCOUNTER — Telehealth: Payer: Self-pay | Admitting: *Deleted

## 2015-06-05 DIAGNOSIS — Z760 Encounter for issue of repeat prescription: Secondary | ICD-10-CM

## 2015-06-05 MED ORDER — NORGESTIMATE-ETH ESTRADIOL 0.25-35 MG-MCG PO TABS
1.0000 | ORAL_TABLET | Freq: Every day | ORAL | Status: DC
Start: 2015-06-05 — End: 2015-06-13

## 2015-06-05 NOTE — Telephone Encounter (Signed)
Refilled 1 pack to hold until next appt

## 2015-06-13 ENCOUNTER — Ambulatory Visit (INDEPENDENT_AMBULATORY_CARE_PROVIDER_SITE_OTHER): Payer: BLUE CROSS/BLUE SHIELD | Admitting: Obstetrics & Gynecology

## 2015-06-13 ENCOUNTER — Encounter: Payer: Self-pay | Admitting: Obstetrics & Gynecology

## 2015-06-13 VITALS — BP 135/91 | HR 80 | Resp 16 | Ht 61.0 in | Wt 156.0 lb

## 2015-06-13 DIAGNOSIS — Z23 Encounter for immunization: Secondary | ICD-10-CM | POA: Diagnosis not present

## 2015-06-13 DIAGNOSIS — Z760 Encounter for issue of repeat prescription: Secondary | ICD-10-CM

## 2015-06-13 DIAGNOSIS — IMO0001 Reserved for inherently not codable concepts without codable children: Secondary | ICD-10-CM

## 2015-06-13 DIAGNOSIS — R030 Elevated blood-pressure reading, without diagnosis of hypertension: Secondary | ICD-10-CM

## 2015-06-13 DIAGNOSIS — Z01419 Encounter for gynecological examination (general) (routine) without abnormal findings: Secondary | ICD-10-CM

## 2015-06-13 DIAGNOSIS — Z3041 Encounter for surveillance of contraceptive pills: Secondary | ICD-10-CM | POA: Diagnosis not present

## 2015-06-13 MED ORDER — NORGESTIMATE-ETH ESTRADIOL 0.25-35 MG-MCG PO TABS: 1.0000 | ORAL_TABLET | Freq: Every day | ORAL | Status: DC

## 2015-06-13 NOTE — Addendum Note (Signed)
Addended by: Asencion Islam on: 06/13/2015 03:23 PM   Modules accepted: Orders

## 2015-06-13 NOTE — Progress Notes (Signed)
  Subjective:     Jessica Richmond is a 39 y.o. female here for a routine exam.  Current complaints: increased BP when she at the MD.  She has home cuff and all are nml.     Gynecologic History Patient's last menstrual period was 06/06/2015. Contraception: OCP (estrogen/progesterone) Last Pap: 2015. Results were: normal Last mammogram: 2015. Results were: normal  Obstetric History OB History  Gravida Para Term Preterm AB SAB TAB Ectopic Multiple Living  0 0 0 0 0 0 0 0 0 0        The following portions of the patient's history were reviewed and updated as appropriate: allergies, current medications, past family history, past medical history, past social history, past surgical history and problem list.  Review of Systems Pertinent items noted in HPI and remainder of comprehensive ROS otherwise negative.    Objective:      Filed Vitals:   06/13/15 1452  BP: 135/91  Pulse: 80  Resp: 16  Height: 5\' 1"  (1.549 m)  Weight: 156 lb (70.761 kg)   Vitals:  WNL General appearance: alert, cooperative and no distress  HEENT: Normocephalic, without obvious abnormality, atraumatic Eyes: negative Throat: lips, mucosa, and tongue normal; teeth and gums normal  Respiratory: Clear to auscultation bilaterally  CV: Regular rate and rhythm  Breasts:  Normal appearance, no masses or tenderness, no nipple retraction or dimpling  GI: Soft, non-tender; bowel sounds normal; no masses,  no organomegaly  GU: External Genitalia:  Tanner V, no lesion Urethra:  No prolapse   Vagina: Pink, normal rugae, no blood or discharge  Cervix: No CMT, no lesion  Uterus:  Normal size and contour, non tender  Adnexa: Normal, no masses, non tender  Musculoskeletal: No edema, redness or tenderness in the calves or thighs  Skin: No lesions or rash  Lymphatic: Axillary adenopathy: none    Psychiatric: Normal mood and behavior    Assessment:    Healthy female exam.    Plan:    Education reviewed: self breast  exams and skin cancer screening. Contraception: OCP (estrogen/progesterone). Mammogram ordered. Follow up in: 1 year.   Mother had mastectomy in her 19s.  Pt do get more detail MGGM--uterine cancer   Patient to send [place] home BPs OCPs refilled

## 2015-07-24 ENCOUNTER — Other Ambulatory Visit: Payer: Self-pay | Admitting: Obstetrics & Gynecology

## 2015-07-24 DIAGNOSIS — Z1231 Encounter for screening mammogram for malignant neoplasm of breast: Secondary | ICD-10-CM

## 2015-08-03 ENCOUNTER — Ambulatory Visit (INDEPENDENT_AMBULATORY_CARE_PROVIDER_SITE_OTHER): Payer: BLUE CROSS/BLUE SHIELD

## 2015-08-03 DIAGNOSIS — Z1231 Encounter for screening mammogram for malignant neoplasm of breast: Secondary | ICD-10-CM

## 2016-03-12 ENCOUNTER — Ambulatory Visit (INDEPENDENT_AMBULATORY_CARE_PROVIDER_SITE_OTHER): Payer: BLUE CROSS/BLUE SHIELD | Admitting: Family

## 2016-03-12 ENCOUNTER — Encounter: Payer: Self-pay | Admitting: Family

## 2016-03-12 DIAGNOSIS — R42 Dizziness and giddiness: Secondary | ICD-10-CM | POA: Insufficient documentation

## 2016-03-12 MED ORDER — MECLIZINE HCL 25 MG PO TABS: 25.0000 mg | ORAL_TABLET | Freq: Three times a day (TID) | ORAL | 1 refills | Status: DC | PRN

## 2016-03-12 NOTE — Progress Notes (Signed)
Subjective:    Patient ID: Jessica Richmond, female    DOB: 1976/08/04, 40 y.o.   MRN: [id number]  Chief Complaint  Patient presents with  . Dizziness    starting thursday had an episode of vertigo which she states has never happened before, happened again on saturday and now feels pressure in her head behind eyes and dizziness    HPI:  Jessica Richmond is a 40 y.o. female who  has a past medical history of Abnormal Pap smear of cervix; Allergic rhinitis; Multiple food allergies; and Vitamin B 12 deficiency. and presents today for an office visit.   This is a new problem. Associated symptom of dizziness has been going on for about 5 days. Symptoms generally wax and wane lasting a couple of minutes at a time. Has not had an episode in the last couple of days. Described as a combination of the room spinning and occasional lightheadedness. Does have about 16oz of caffeine daily and drinks water remainder of the day. No fevers or congestion. Denies head trauma or changes in vision. Denies any attempted treatments or modifying factors. No previous history of vertigo.    Allergies  Allergen Reactions  . Keflex [Cephalexin] Hives    hives  . Indomethacin Nausea Only    Dizziness  . Milk-Related Compounds   . Shrimp [Shellfish Allergy]   . Wheat Bran      Current Outpatient Prescriptions on File Prior to Visit  Medication Sig Dispense Refill  . cyanocobalamin ([facility] VITAMIN B12) 2000 MCG tablet Take 1 tablet (2,000 mcg total) by mouth daily. 100 tablet 3  . EPIPEN 2-PAK 0.3 MG/0.3ML SOAJ injection     . fluticasone (FLONASE) 50 MCG/ACT nasal spray Place 1 spray into both nostrils daily.    . norgestimate-ethinyl estradiol (ORTHO-CYCLEN, 28,) 0.25-35 MG-MCG tablet Take 1 tablet by mouth daily. 1 Package 12   No current facility-administered medications on file prior to visit.      Review of Systems  Constitutional: Negative for chills and fever.  HENT: Negative for congestion, ear pain,  sinus pressure, sore throat and tinnitus.   Respiratory: Negative for chest tightness and shortness of breath.   Cardiovascular: Negative for chest pain, palpitations and leg swelling.  Neurological: Positive for dizziness and light-headedness.      Objective:    BP (!) 142/88 (BP Location: Left Arm)   Pulse 82   Temp 97.6 F (36.4 C) (Oral)   Resp 16   Ht 5\' 1"  (1.549 m)   Wt 156 lb (70.8 kg)   SpO2 96%   BMI 29.48 kg/m  Nursing note and vital signs reviewed.  Physical Exam  Constitutional: She is oriented to person, place, and time. She appears well-developed and well-nourished. No distress.  HENT:  Right Ear: Hearing, tympanic membrane, external ear and ear canal normal.  Left Ear: Hearing, tympanic membrane, external ear and ear canal normal.  Nose: Nose normal.  Mouth/Throat: Uvula is midline, oropharynx is clear and moist and mucous membranes are normal.  Cardiovascular: Normal rate, regular rhythm, normal heart sounds and intact distal pulses.   Pulmonary/Chest: Effort normal and breath sounds normal.  Neurological: She is alert and oriented to person, place, and time.  Modified Dix-Hallpike with mild increase in symptoms with head towards the left. No significant dizziness or nystagmus.   Skin: Skin is warm and dry.  Psychiatric: She has a normal mood and affect. Her behavior is normal. Judgment and thought content normal.  Assessment & Plan:   Problem List Items Addressed This Visit      Other   Dizziness    Symptoms and exam consistent with vertigo although possible increased amount of congestion secondary to allergies may be contributing to her dizziness. Start meclizine. Continue hydration. Recommend starting antihistamine of choice. Follow up if symptoms worsen or do not improve.       Relevant Medications   meclizine (ANTIVERT) 25 MG tablet    Other Visit Diagnoses   None.     I have discontinued Ms. Salaiz loratadine. I am also having her  start on meclizine. Additionally, I am having her maintain her fluticasone, cyanocobalamin, EPIPEN 2-PAK, and norgestimate-ethinyl estradiol.   Meds ordered this encounter  Medications  . meclizine (ANTIVERT) 25 MG tablet    Sig: Take 1 tablet (25 mg total) by mouth 3 (three) times daily as needed for dizziness.    Dispense:  30 tablet    Refill:  1    Order Specific Question:   Supervising Provider    Answer:   Pricilla Holm A [id number]     Follow-up: Return if symptoms worsen or fail to improve.  Mauricio Po, FNP

## 2016-03-12 NOTE — Assessment & Plan Note (Signed)
Symptoms and exam consistent with vertigo although possible increased amount of congestion secondary to allergies may be contributing to her dizziness. Start meclizine. Continue hydration. Recommend starting antihistamine of choice. Follow up if symptoms worsen or do not improve.

## 2016-03-12 NOTE — Patient Instructions (Signed)
Thank you for choosing [facility].  Summary/Instructions:   Please restart an anti-histamine for the next several days.   Start the meclizine as needed for dizziness.  If your symptoms worsen, please let us know.  Your prescription(s) have been submitted to your pharmacy or been printed and provided for you. Please take as directed and contact our office if you believe you are having problem(s) with the medication(s) or have any questions.  If your symptoms worsen or fail to improve, please contact our office for further instruction, or in case of emergency go directly to the emergency room at the closest medical facility.     Dizziness Dizziness is a common problem. It is a feeling of unsteadiness or light-headedness. You may feel like you are about to faint. Dizziness can lead to injury if you stumble or fall. Anyone can become dizzy, but dizziness is more common in older adults. This condition can be caused by a number of things, including medicines, dehydration, or illness. HOME CARE INSTRUCTIONS Taking these steps may help with your condition: Eating and Drinking  Drink enough fluid to keep your urine clear or pale yellow. This helps to keep you from becoming dehydrated. Try to drink more clear fluids, such as water.  Do not drink alcohol.  Limit your caffeine intake if directed by your health care provider.  Limit your salt intake if directed by your health care provider. Activity  Avoid making quick movements.  Rise slowly from chairs and steady yourself until you feel okay.  In the morning, first sit up on the side of the bed. When you feel okay, stand slowly while you hold onto something until you know that your balance is fine.  Move your legs often if you need to stand in one place for a long time. Tighten and relax your muscles in your legs while you are standing.  Do not drive or operate heavy machinery if you feel dizzy.  Avoid bending down if you feel  dizzy. Place items in your home so that they are easy for you to reach without leaning over. Lifestyle  Do not use any tobacco products, including cigarettes, chewing tobacco, or electronic cigarettes. If you need help quitting, ask your health care provider.  Try to reduce your stress level, such as with yoga or meditation. Talk with your health care provider if you need help. General Instructions  Watch your dizziness for any changes.  Take medicines only as directed by your health care provider. Talk with your health care provider if you think that your dizziness is caused by a medicine that you are taking.  Tell a friend or a family member that you are feeling dizzy. If he or she notices any changes in your behavior, have this person call your health care provider.  Keep all follow-up visits as directed by your health care provider. This is important. SEEK MEDICAL CARE IF:  Your dizziness does not go away.  Your dizziness or light-headedness gets worse.  You feel nauseous.  You have reduced hearing.  You have new symptoms.  You are unsteady on your feet or you feel like the room is spinning. SEEK IMMEDIATE MEDICAL CARE IF:  You vomit or have diarrhea and are unable to eat or drink anything.  You have problems talking, walking, swallowing, or using your arms, hands, or legs.  You feel generally weak.  You are not thinking clearly or you have trouble forming sentences. It may take a friend or family member  to notice this.  You have chest pain, abdominal pain, shortness of breath, or sweating.  Your vision changes.  You notice any bleeding.  You have a headache.  You have neck pain or a stiff neck.  You have a fever.   This information is not intended to replace advice given to you by your health care provider. Make sure you discuss any questions you have with your health care provider.   Document Released: 01/29/2001 Document Revised: 12/20/2014 Document  Reviewed: 08/01/2014 Elsevier Interactive Patient Education 2016 [employer].  Vertigo Vertigo means you feel like you or your surroundings are moving when they are not. Vertigo can be dangerous if it occurs when you are at work, driving, or performing difficult activities.  CAUSES  Vertigo occurs when there is a conflict of signals sent to your brain from the visual and sensory systems in your body. There are many different causes of vertigo, including:  Infections, especially in the inner ear.  A bad reaction to a drug or misuse of alcohol and medicines.  Withdrawal from drugs or alcohol.  Rapidly changing positions, such as lying down or rolling over in bed.  A migraine headache.  Decreased blood flow to the brain.  Increased pressure in the brain from a head injury, infection, tumor, or bleeding. SYMPTOMS  You may feel as though the world is spinning around or you are falling to the ground. Because your balance is upset, vertigo can cause nausea and vomiting. You may have involuntary eye movements (nystagmus). DIAGNOSIS  Vertigo is usually diagnosed by physical exam. If the cause of your vertigo is unknown, your caregiver may perform imaging tests, such as an MRI scan (magnetic resonance imaging). TREATMENT  Most cases of vertigo resolve on their own, without treatment. Depending on the cause, your caregiver may prescribe certain medicines. If your vertigo is related to body position issues, your caregiver may recommend movements or procedures to correct the problem. In rare cases, if your vertigo is caused by certain inner ear problems, you may need surgery. HOME CARE INSTRUCTIONS   Follow your caregiver's instructions.  Avoid driving.  Avoid operating heavy machinery.  Avoid performing any tasks that would be dangerous to you or others during a vertigo episode.  Tell your caregiver if you notice that certain medicines seem to be causing your vertigo. Some of the medicines  used to treat vertigo episodes can actually make them worse in some people. SEEK IMMEDIATE MEDICAL CARE IF:   Your medicines do not relieve your vertigo or are making it worse.  You develop problems with talking, walking, weakness, or using your arms, hands, or legs.  You develop severe headaches.  Your nausea or vomiting continues or gets worse.  You develop visual changes.  A family member notices behavioral changes.  Your condition gets worse. MAKE SURE YOU:  Understand these instructions.  Will watch your condition.  Will get help right away if you are not doing well or get worse.   This information is not intended to replace advice given to you by your health care provider. Make sure you discuss any questions you have with your health care provider.   Document Released: 05/15/2005 Document Revised: 10/28/2011 Document Reviewed: 11/28/2014 Elsevier Interactive Patient Education [employer].

## 2016-07-24 ENCOUNTER — Ambulatory Visit (INDEPENDENT_AMBULATORY_CARE_PROVIDER_SITE_OTHER): Payer: BLUE CROSS/BLUE SHIELD | Admitting: Obstetrics & Gynecology

## 2016-07-24 ENCOUNTER — Encounter: Payer: Self-pay | Admitting: Obstetrics & Gynecology

## 2016-07-24 VITALS — BP 153/94 | HR 91 | Ht 61.0 in | Wt 161.0 lb

## 2016-07-24 DIAGNOSIS — Z01419 Encounter for gynecological examination (general) (routine) without abnormal findings: Secondary | ICD-10-CM | POA: Diagnosis not present

## 2016-07-24 DIAGNOSIS — Z0000 Encounter for general adult medical examination without abnormal findings: Secondary | ICD-10-CM | POA: Diagnosis not present

## 2016-07-24 MED ORDER — MISOPROSTOL 200 MCG PO TABS: ORAL_TABLET | ORAL | 0 refills | Status: DC

## 2016-07-24 NOTE — Progress Notes (Signed)
Subjective:     Jessica Richmond is a 40 y.o. female here for a routine exam.  Current complaints: borderline BP at home (130s/80s).  Pt on low sodium diet.  Pt has gained a little bit of weight (15-20#)   Gynecologic History Patient's last menstrual period was 06/19/2016. Contraception: OCP (estrogen/progesterone) but wants IUD (Mirena) Last Pap: 2015. Results were: normal Last mammogram: 2016. Results were: normal  Obstetric History OB History  Gravida Para Term Preterm AB Living  0 0 0 0 0 0  SAB TAB Ectopic Multiple Live Births  0 0 0 0           The following portions of the patient's history were reviewed and updated as appropriate: allergies, current medications, past family history, past medical history, past social history, past surgical history and problem list.  Review of Systems Pertinent items noted in HPI and remainder of comprehensive ROS otherwise negative.    Objective:      Vitals:   07/24/16 0924  BP: (!) 153/94  Pulse: 91  Weight: 161 lb (73 kg)  Height: 5\' 1"  (1.549 m)   Vitals:  WNL General appearance: alert, cooperative and no distress  HEENT: Normocephalic, without obvious abnormality, atraumatic Eyes: negative Throat: lips, mucosa, and tongue normal; teeth and gums normal  Respiratory: Clear to auscultation bilaterally  CV: Regular rate and rhythm  Breasts:  Normal appearance, no masses or tenderness, no nipple retraction or dimpling  GI: Soft, non-tender; bowel sounds normal; no masses,  no organomegaly  GU: External Genitalia:  Tanner V, no lesion Urethra:  No prolapse   Vagina: Pink, normal rugae, no blood or discharge  Cervix: No CMT, no lesion  Uterus:  Normal size and contour, non tender  Adnexa: Normal, no masses, non tender  Musculoskeletal: No edema, redness or tenderness in the calves or thighs  Skin: No lesions or rash  Lymphatic: Axillary adenopathy: none     Psychiatric: Normal mood and behavior   Home BP readings are  130s/90s  Assessment:    Healthy female exam.    Plan:    Education reviewed: cardio / weight loss. Contraception: wants Mirena IUD. Mammogram ordered. Follow up in: 2 days.  IUD insertion  -F/U with PCP regarding BP and get cholesterol checked.

## 2016-07-25 LAB — GC/CHLAMYDIA PROBE AMP
CT PROBE, AMP APTIMA: NOT DETECTED
GC PROBE AMP APTIMA: NOT DETECTED

## 2016-07-26 ENCOUNTER — Encounter: Payer: Self-pay | Admitting: Family

## 2016-07-26 ENCOUNTER — Ambulatory Visit (INDEPENDENT_AMBULATORY_CARE_PROVIDER_SITE_OTHER): Payer: BLUE CROSS/BLUE SHIELD | Admitting: Family

## 2016-07-26 ENCOUNTER — Encounter: Payer: Self-pay | Admitting: *Deleted

## 2016-07-26 VITALS — BP 138/88 | HR 79 | Resp 16 | Ht 61.0 in | Wt 161.0 lb

## 2016-07-26 DIAGNOSIS — Z30430 Encounter for insertion of intrauterine contraceptive device: Secondary | ICD-10-CM

## 2016-07-26 DIAGNOSIS — Z3202 Encounter for pregnancy test, result negative: Secondary | ICD-10-CM | POA: Diagnosis not present

## 2016-07-26 LAB — POCT URINE PREGNANCY: Preg Test, Ur: NEGATIVE

## 2016-07-26 MED ORDER — LEVONORGESTREL 20 MCG/24HR IU IUD
INTRAUTERINE_SYSTEM | Freq: Once | INTRAUTERINE | Status: AC
  Administered 2016-07-26: 10:00:00 via INTRAUTERINE

## 2016-07-26 NOTE — Progress Notes (Signed)
  HPI:  Jessica Richmond is a 40 y.o. year old female here for IUD insertion.   Patient's last menstrual period was 06/19/2016. and her pregnancy test today was negative.  Risks/benefits/side effects of IUD have been discussed and her questions have been answered.  Jessica Richmond is aware of the common side effect of irregular bleeding, which the incidence of decreases over time.   Past Medical History: Past Medical History:  Diagnosis Date  . Abnormal Pap smear of cervix    ASCUS retested neg  . Allergic rhinitis   . Multiple food allergies    wheat, Milk, shrimp  . Vitamin B 12 deficiency     Past Surgical History: Past Surgical History:  Procedure Laterality Date  . HIP ARTHROSCOPY Left    torn labrumin  . WISDOM TOOTH EXTRACTION      Family History: Family History  Problem Relation Age of Onset  . Heart disease Mother 23    A fib  . Breast cancer Mother     mastectomy  . Hypertension Father   . Diabetes Father   . Diverticulosis Father   . Crohn's disease Sister   . Uterine cancer Other     maternal great grandmother    Social History: Social History  Substance Use Topics  . Smoking status: Never Smoker  . Smokeless tobacco: Never Used  . Alcohol use Yes     Comment: social    Allergies:  Allergies  Allergen Reactions  . Keflex [Cephalexin] Hives    hives  . Indomethacin Nausea Only, Other (See Comments) and Nausea And Vomiting    Dizziness  . Milk-Related Compounds   . Shrimp [Shellfish Allergy]   . Wheat Bran    Pt consented for IUD insertion.  Pt placed in lithotomy position.  Speculum inserted and cervix cleaned with betadine solution.  Uterus sounded to 5.5cm; IUD inserted without difficulty.  Strings cut at approximately 3 cm.  Speculum removed.  Assessment: IUD Insertion  Plan: Plans to reschedule for string check Reviewed common side effects again after insertion  Gwen Pounds, CNM  [place], [facility] 07/26/2016 10:10 AM

## 2016-08-26 ENCOUNTER — Ambulatory Visit (INDEPENDENT_AMBULATORY_CARE_PROVIDER_SITE_OTHER): Payer: BLUE CROSS/BLUE SHIELD | Admitting: Advanced Practice Midwife

## 2016-08-26 ENCOUNTER — Encounter: Payer: Self-pay | Admitting: Advanced Practice Midwife

## 2016-08-26 VITALS — BP 147/86 | HR 83 | Ht 61.0 in | Wt 161.0 lb

## 2016-08-26 DIAGNOSIS — Z30431 Encounter for routine checking of intrauterine contraceptive device: Secondary | ICD-10-CM

## 2016-08-26 NOTE — Patient Instructions (Signed)

## 2016-08-26 NOTE — Progress Notes (Signed)
   Subjective:    Patient ID: Jessica Richmond, female    DOB: 01/27/1976, 41 y.o.   MRN: [id number]  HPI Seen today for string check post IUD insertion on 07/26/16 States had had a little spotting but no significant bleeding or pain   Review of Systems  Constitutional: Negative for activity change, chills and fever.  Cardiovascular: Negative for leg swelling.  Gastrointestinal: Negative for abdominal pain, constipation, diarrhea, nausea and vomiting.  Genitourinary: Negative for dysuria, vaginal bleeding, vaginal discharge and vaginal pain.  Musculoskeletal: Negative for back pain.       Objective:   Physical Exam  Constitutional: She is oriented to person, place, and time. She appears well-developed and well-nourished. No distress.  HENT:  Head: Normocephalic.  Cardiovascular: Normal rate and regular rhythm.   Pulmonary/Chest: Effort normal. No respiratory distress.  Abdominal: Soft. She exhibits no distension. There is no tenderness. There is no rebound and no guarding.  Genitourinary:  Genitourinary Comments: Speculum exam showed strings protruding from cervical os  Musculoskeletal: Normal range of motion.  Neurological: She is alert and oriented to person, place, and time.  Skin: Skin is warm and dry.  Psychiatric: She has a normal mood and affect.          Assessment & Plan:  Post Mirena IUD string check  Continue normal activities Yearly annual exam Discussed may have some spotting from time to time Follow up prn

## 2016-09-18 ENCOUNTER — Other Ambulatory Visit (INDEPENDENT_AMBULATORY_CARE_PROVIDER_SITE_OTHER): Payer: BLUE CROSS/BLUE SHIELD

## 2016-09-18 ENCOUNTER — Ambulatory Visit (INDEPENDENT_AMBULATORY_CARE_PROVIDER_SITE_OTHER): Payer: BLUE CROSS/BLUE SHIELD | Admitting: Internal Medicine

## 2016-09-18 ENCOUNTER — Encounter: Payer: Self-pay | Admitting: Internal Medicine

## 2016-09-18 VITALS — BP 150/94 | HR 73 | Temp 98.5°F | Resp 20 | Ht 61.0 in | Wt 163.0 lb

## 2016-09-18 DIAGNOSIS — Z23 Encounter for immunization: Secondary | ICD-10-CM

## 2016-09-18 DIAGNOSIS — Z0000 Encounter for general adult medical examination without abnormal findings: Secondary | ICD-10-CM

## 2016-09-18 DIAGNOSIS — Z91013 Allergy to seafood: Secondary | ICD-10-CM | POA: Insufficient documentation

## 2016-09-18 DIAGNOSIS — E538 Deficiency of other specified B group vitamins: Secondary | ICD-10-CM | POA: Diagnosis not present

## 2016-09-18 DIAGNOSIS — R030 Elevated blood-pressure reading, without diagnosis of hypertension: Secondary | ICD-10-CM | POA: Diagnosis not present

## 2016-09-18 DIAGNOSIS — K9041 Non-celiac gluten sensitivity: Secondary | ICD-10-CM

## 2016-09-18 DIAGNOSIS — R635 Abnormal weight gain: Secondary | ICD-10-CM | POA: Insufficient documentation

## 2016-09-18 DIAGNOSIS — K900 Celiac disease: Secondary | ICD-10-CM

## 2016-09-18 LAB — BASIC METABOLIC PANEL
BUN: 14 mg/dL (ref 6–23)
CHLORIDE: 107 meq/L (ref 96–112)
CO2: 28 meq/L (ref 19–32)
CREATININE: 0.88 mg/dL (ref 0.40–1.20)
Calcium: 9.4 mg/dL (ref 8.4–10.5)
GFR: 74.7 mL/min (ref >60.00)
GLUCOSE: 97 mg/dL (ref 70–99)
Potassium: 4.2 mEq/L (ref 3.5–5.1)
Sodium: 139 mEq/L (ref 135–145)

## 2016-09-18 LAB — VITAMIN B12: VITAMIN B 12: 858 pg/mL (ref 211–911)

## 2016-09-18 LAB — HEPATIC FUNCTION PANEL
ALBUMIN: 4.3 g/dL (ref 3.5–5.2)
ALK PHOS: 74 U/L (ref 39–117)
ALT: 24 U/L (ref 0–35)
AST: 27 U/L (ref 0–37)
BILIRUBIN DIRECT: 0.2 mg/dL (ref 0.0–0.3)
TOTAL PROTEIN: 6.9 g/dL (ref 6.0–8.3)
Total Bilirubin: 1.1 mg/dL (ref 0.2–1.2)

## 2016-09-18 LAB — URINALYSIS, ROUTINE W REFLEX MICROSCOPIC
Bilirubin Urine: NEGATIVE
Ketones, ur: NEGATIVE
Leukocytes, UA: NEGATIVE
Nitrite: NEGATIVE
PH: 6 (ref 5.0–8.0)
SPECIFIC GRAVITY, URINE: 1.025 (ref 1.000–1.030)
TOTAL PROTEIN, URINE-UPE24: NEGATIVE
UROBILINOGEN UA: 0.2 (ref 0.0–1.0)
Urine Glucose: NEGATIVE

## 2016-09-18 LAB — LIPID PANEL
CHOL/HDL RATIO: 4
Cholesterol: 192 mg/dL (ref 0–200)
HDL: 51.6 mg/dL (ref >39.00)
LDL Cholesterol: 121 mg/dL — ABNORMAL HIGH (ref 0–99)
NONHDL: 140.37
TRIGLYCERIDES: 98 mg/dL (ref 0.0–149.0)
VLDL: 19.6 mg/dL (ref 0.0–40.0)

## 2016-09-18 LAB — CBC WITH DIFFERENTIAL/PLATELET
BASOS ABS: 0 10*3/uL (ref 0.0–0.1)
Basophils Relative: 0.5 % (ref 0.0–3.0)
EOS PCT: 1.3 % (ref 0.0–5.0)
Eosinophils Absolute: 0.1 10*3/uL (ref 0.0–0.7)
HCT: 40.9 % (ref 36.0–46.0)
Hemoglobin: 13.7 g/dL (ref 12.0–15.0)
LYMPHS ABS: 1.6 10*3/uL (ref 0.7–4.0)
LYMPHS PCT: 23.1 % (ref 12.0–46.0)
MCHC: 33.6 g/dL (ref 30.0–36.0)
MCV: 89.2 fl (ref 78.0–100.0)
MONOS PCT: 8.1 % (ref 3.0–12.0)
Monocytes Absolute: 0.6 10*3/uL (ref 0.1–1.0)
NEUTROS PCT: 67 % (ref 43.0–77.0)
Neutro Abs: 4.7 10*3/uL (ref 1.4–7.7)
Platelets: 246 10*3/uL (ref 150.0–400.0)
RBC: 4.58 Mil/uL (ref 3.87–5.11)
RDW: 13.2 % (ref 11.5–15.5)
WBC: 7 10*3/uL (ref 4.0–10.5)

## 2016-09-18 LAB — TSH: TSH: 3.23 u[IU]/mL (ref 0.35–4.50)

## 2016-09-18 MED ORDER — HYDROCHLOROTHIAZIDE 12.5 MG PO CAPS: 12.5000 mg | ORAL_CAPSULE | Freq: Every day | ORAL | 11 refills | Status: DC

## 2016-09-18 MED ORDER — EPINEPHRINE 0.3 MG/0.3ML IJ SOAJ: 0.3000 mg | Freq: Once | INTRAMUSCULAR | 3 refills | Status: AC

## 2016-09-18 NOTE — Progress Notes (Signed)
Subjective:  Patient ID: Jessica Richmond, female    DOB: Jan 08, 1976  Age: 41 y.o. MRN: [id number]  CC: Weight Gain (Referral to Dietician for weight loss and food sensitivities)   HPI Jessica Richmond presents for wt gain 10 lbs x 1 year, elevated BP  Outpatient Medications Prior to Visit  Medication Sig Dispense Refill  . cetirizine (ZYRTEC) 10 MG tablet Take 10 mg by mouth daily.    . cyanocobalamin ([facility] VITAMIN B12) 2000 MCG tablet Take 1 tablet (2,000 mcg total) by mouth daily. 100 tablet 3  . EPIPEN 2-PAK 0.3 MG/0.3ML SOAJ injection     . fluticasone (FLONASE) 50 MCG/ACT nasal spray Place 1 spray into both nostrils daily.    . meclizine (ANTIVERT) 25 MG tablet Take 1 tablet (25 mg total) by mouth 3 (three) times daily as needed for dizziness. (Patient not taking: Reported on 08/26/2016) 30 tablet 1   No facility-administered medications prior to visit.     ROS Review of Systems  Constitutional: Positive for unexpected weight change. Negative for activity change, appetite change, chills and fatigue.  HENT: Negative for congestion, mouth sores and sinus pressure.   Eyes: Negative for visual disturbance.  Respiratory: Negative for cough and chest tightness.   Gastrointestinal: Negative for abdominal pain and nausea.  Genitourinary: Negative for difficulty urinating, frequency and vaginal pain.  Musculoskeletal: Negative for back pain and gait problem.  Skin: Negative for pallor and rash.  Neurological: Negative for dizziness, tremors, weakness, numbness and headaches.  Psychiatric/Behavioral: Negative for confusion, sleep disturbance and suicidal ideas.    Objective:  BP (!) 150/94 (BP Location: Left Arm, Patient Position: Sitting, Cuff Size: Normal)   Pulse 73   Temp 98.5 F (36.9 C) (Oral)   Resp 20   Ht 5\' 1"  (1.549 m)   Wt 163 lb (73.9 kg)   SpO2 98%   BMI 30.80 kg/m   BP Readings from Last 3 Encounters:  09/18/16 (!) 150/94  08/26/16 (!) 147/86  07/26/16 138/88     Wt Readings from Last 3 Encounters:  09/18/16 163 lb (73.9 kg)  08/26/16 161 lb (73 kg)  07/26/16 161 lb (73 kg)    Physical Exam  Constitutional: She appears well-developed. No distress.  HENT:  Head: Normocephalic.  Right Ear: External ear normal.  Left Ear: External ear normal.  Nose: Nose normal.  Mouth/Throat: Oropharynx is clear and moist.  Eyes: Conjunctivae are normal. Pupils are equal, round, and reactive to light. Right eye exhibits no discharge. Left eye exhibits no discharge.  Neck: Normal range of motion. Neck supple. No JVD present. No tracheal deviation present. No thyromegaly present.  Cardiovascular: Normal rate, regular rhythm and normal heart sounds.   Pulmonary/Chest: No stridor. No respiratory distress. She has no wheezes.  Abdominal: Soft. Bowel sounds are normal. She exhibits no distension and no mass. There is no tenderness. There is no rebound and no guarding.  Musculoskeletal: She exhibits no edema or tenderness.  Lymphadenopathy:    She has no cervical adenopathy.  Neurological: She displays normal reflexes. No cranial nerve deficit. She exhibits normal muscle tone. Coordination normal.  Skin: No rash noted. No erythema.  Psychiatric: She has a normal mood and affect. Her behavior is normal. Judgment and thought content normal.    Lab Results  Component Value Date   WBC 5.9 12/06/2013   HGB 13.4 12/06/2013   HCT 40.0 12/06/2013   PLT 285.0 12/06/2013   GLUCOSE 90 12/06/2013   CHOL 220 (H) 12/06/2013  TRIG 129.0 12/06/2013   HDL 59.90 12/06/2013   LDLCALC 134 (H) 12/06/2013   ALT 22 12/06/2013   AST 29 12/06/2013   NA 137 12/06/2013   K 4.1 12/06/2013   CL 104 12/06/2013   CREATININE 0.7 12/06/2013   BUN 13 12/06/2013   CO2 26 12/06/2013   TSH 1.93 12/06/2013    Patient was never admitted.  Assessment & Plan:   There are no diagnoses linked to this encounter. I am having Ms. Mcmurdie maintain her fluticasone, cyanocobalamin,  EPIPEN 2-PAK, meclizine, and cetirizine.  No orders of the defined types were placed in this encounter.    Follow-up: No Follow-up on file.  Walker Kehr, MD

## 2016-09-18 NOTE — Progress Notes (Signed)
Pre visit review using our clinic review tool, if applicable. No additional management support is needed unless otherwise documented below in the visit note. 

## 2016-09-18 NOTE — Assessment & Plan Note (Signed)
Epi pen 

## 2016-09-18 NOTE — Assessment & Plan Note (Signed)
Nutr ref

## 2016-09-18 NOTE — Assessment & Plan Note (Signed)
Labs HCTZ

## 2016-09-18 NOTE — Assessment & Plan Note (Signed)
On B12 

## 2016-11-11 ENCOUNTER — Other Ambulatory Visit: Payer: Self-pay | Admitting: Obstetrics & Gynecology

## 2016-11-11 DIAGNOSIS — Z1231 Encounter for screening mammogram for malignant neoplasm of breast: Secondary | ICD-10-CM

## 2016-11-25 DIAGNOSIS — M25511 Pain in right shoulder: Secondary | ICD-10-CM | POA: Diagnosis not present

## 2016-11-26 ENCOUNTER — Ambulatory Visit (INDEPENDENT_AMBULATORY_CARE_PROVIDER_SITE_OTHER): Payer: BLUE CROSS/BLUE SHIELD

## 2016-11-26 DIAGNOSIS — Z1231 Encounter for screening mammogram for malignant neoplasm of breast: Secondary | ICD-10-CM | POA: Diagnosis not present

## 2016-12-16 DIAGNOSIS — M25511 Pain in right shoulder: Secondary | ICD-10-CM | POA: Diagnosis not present

## 2019-05-24 ENCOUNTER — Ambulatory Visit (INDEPENDENT_AMBULATORY_CARE_PROVIDER_SITE_OTHER): Payer: BC Managed Care – PPO | Admitting: Obstetrics & Gynecology

## 2019-05-24 ENCOUNTER — Other Ambulatory Visit: Payer: Self-pay

## 2019-05-24 ENCOUNTER — Encounter: Payer: Self-pay | Admitting: Obstetrics & Gynecology

## 2019-05-24 VITALS — BP 154/88 | HR 90 | Resp 16 | Ht 61.0 in | Wt 172.0 lb

## 2019-05-24 DIAGNOSIS — Z01419 Encounter for gynecological examination (general) (routine) without abnormal findings: Secondary | ICD-10-CM | POA: Diagnosis not present

## 2019-05-24 DIAGNOSIS — Z1231 Encounter for screening mammogram for malignant neoplasm of breast: Secondary | ICD-10-CM

## 2019-05-24 DIAGNOSIS — Z1151 Encounter for screening for human papillomavirus (HPV): Secondary | ICD-10-CM

## 2019-05-24 DIAGNOSIS — Z23 Encounter for immunization: Secondary | ICD-10-CM | POA: Diagnosis not present

## 2019-05-24 DIAGNOSIS — Z124 Encounter for screening for malignant neoplasm of cervix: Secondary | ICD-10-CM

## 2019-05-24 DIAGNOSIS — I10 Essential (primary) hypertension: Secondary | ICD-10-CM

## 2019-05-24 NOTE — Progress Notes (Signed)
Subjective:     Jessica Richmond is a 43 y.o. female here for a routine exam.  Current complaints: job stress ([employer]).  Light menses with IUD.   Gynecologic History No LMP recorded. (Menstrual status: IUD). Contraception: IUD Last Pap: 2015. Results were: normal Last mammogram: 2018. Results were: normal  Obstetric History OB History  Gravida Para Term Preterm AB Living  0 0 0 0 0 0  SAB TAB Ectopic Multiple Live Births  0 0 0 0       The following portions of the patient's history were reviewed and updated as appropriate: allergies, current medications, past family history, past medical history, past social history, past surgical history and problem list.  Review of Systems Pertinent items noted in HPI and remainder of comprehensive ROS otherwise negative.    Objective:      Vitals:   05/24/19 1438  BP: (!) 154/88  Pulse: 90  Resp: 16  Weight: 172 lb (78 kg)  Height: 5\' 1"  (1.549 m)   Vitals:  WNL General appearance: alert, cooperative and no distress  HEENT: Normocephalic, without obvious abnormality, atraumatic Eyes: negative Throat: lips, mucosa, and tongue normal; teeth and gums normal  Respiratory: Clear to auscultation bilaterally  CV: Regular rate and rhythm  Breasts:  Normal appearance, no masses or tenderness, no nipple retraction or dimpling  GI: Soft, non-tender; bowel sounds normal; no masses,  no organomegaly  GU: External Genitalia:  Tanner V, no lesion Urethra:  No prolapse   Vagina: Pink, normal rugae, no blood or discharge  Cervix: No CMT, no lesion  Uterus:  Normal size and contour, non tender  Adnexa: Normal, no masses, non tender  Musculoskeletal: No edema, redness or tenderness in the calves or thighs  Skin: No lesions or rash  Lymphatic: Axillary adenopathy: none     Psychiatric: Normal mood and behavior        Assessment:    Healthy female exam.    Plan:    Pap with co testing Flu shot Mammogram Labs and f/u with  PCP Pt was nervous about coming (it had been a long time and she was not looking forward to being weighed--pt was told she can refuse weight checks at our office in the future).

## 2019-05-31 LAB — CYTOLOGY - PAP
Diagnosis: NEGATIVE
High risk HPV: NEGATIVE

## 2019-06-03 DIAGNOSIS — R635 Abnormal weight gain: Secondary | ICD-10-CM | POA: Diagnosis not present

## 2019-06-03 DIAGNOSIS — Z0000 Encounter for general adult medical examination without abnormal findings: Secondary | ICD-10-CM | POA: Diagnosis not present

## 2019-06-03 DIAGNOSIS — E538 Deficiency of other specified B group vitamins: Secondary | ICD-10-CM | POA: Diagnosis not present

## 2019-06-03 DIAGNOSIS — R030 Elevated blood-pressure reading, without diagnosis of hypertension: Secondary | ICD-10-CM | POA: Diagnosis not present

## 2019-06-04 LAB — COMPREHENSIVE METABOLIC PANEL
AG Ratio: 1.9 (calc) (ref 1.0–2.5)
ALT: 59 U/L — ABNORMAL HIGH (ref 6–29)
AST: 23 U/L (ref 10–35)
Albumin: 4.3 g/dL (ref 3.6–5.1)
Alkaline phosphatase (APISO): 94 U/L (ref 31–125)
BUN: 15 mg/dL (ref 7–25)
CO2: 27 mmol/L (ref 20–32)
Calcium: 9.8 mg/dL (ref 8.6–10.2)
Chloride: 106 mmol/L (ref 98–110)
Creat: 0.86 mg/dL (ref 0.50–1.10)
Globulin: 2.3 g/dL (calc) (ref 1.9–3.7)
Glucose, Bld: 98 mg/dL (ref 65–99)
Potassium: 4.8 mmol/L (ref 3.5–5.3)
Sodium: 139 mmol/L (ref 135–146)
Total Bilirubin: 0.9 mg/dL (ref 0.2–1.2)
Total Protein: 6.6 g/dL (ref 6.1–8.1)

## 2019-06-04 LAB — LIPID PANEL
Cholesterol: 209 mg/dL — ABNORMAL HIGH (ref <200)
HDL: 51 mg/dL (ref >=50)
LDL Cholesterol (Calc): 135 mg/dL (calc) — ABNORMAL HIGH
Non-HDL Cholesterol (Calc): 158 mg/dL (calc) — ABNORMAL HIGH (ref <130)
Total CHOL/HDL Ratio: 4.1 (calc) (ref <5.0)
Triglycerides: 118 mg/dL (ref <150)

## 2019-06-04 LAB — TSH: TSH: 3.65 mIU/L

## 2019-06-04 LAB — CBC
HCT: 42.7 % (ref 35.0–45.0)
Hemoglobin: 14.2 g/dL (ref 11.7–15.5)
MCH: 30.1 pg (ref 27.0–33.0)
MCHC: 33.3 g/dL (ref 32.0–36.0)
MCV: 90.7 fL (ref 80.0–100.0)
MPV: 11.5 fL (ref 7.5–12.5)
Platelets: 365 10*3/uL (ref 140–400)
RBC: 4.71 10*6/uL (ref 3.80–5.10)
RDW: 11.8 % (ref 11.0–15.0)
WBC: 6 10*3/uL (ref 3.8–10.8)

## 2019-06-04 LAB — VITAMIN D 25 HYDROXY (VIT D DEFICIENCY, FRACTURES): Vit D, 25-Hydroxy: 27 ng/mL — ABNORMAL LOW (ref 30–100)

## 2019-06-07 ENCOUNTER — Other Ambulatory Visit: Payer: Self-pay | Admitting: Obstetrics & Gynecology

## 2019-06-17 ENCOUNTER — Other Ambulatory Visit: Payer: Self-pay

## 2019-06-17 ENCOUNTER — Ambulatory Visit (INDEPENDENT_AMBULATORY_CARE_PROVIDER_SITE_OTHER): Payer: BC Managed Care – PPO

## 2019-06-17 DIAGNOSIS — Z01419 Encounter for gynecological examination (general) (routine) without abnormal findings: Secondary | ICD-10-CM

## 2019-06-17 DIAGNOSIS — Z1231 Encounter for screening mammogram for malignant neoplasm of breast: Secondary | ICD-10-CM | POA: Diagnosis not present

## 2019-09-02 ENCOUNTER — Other Ambulatory Visit: Payer: Self-pay

## 2019-09-02 ENCOUNTER — Ambulatory Visit (INDEPENDENT_AMBULATORY_CARE_PROVIDER_SITE_OTHER): Payer: BC Managed Care – PPO | Admitting: Osteopathic Medicine

## 2019-09-02 ENCOUNTER — Encounter: Payer: Self-pay | Admitting: Osteopathic Medicine

## 2019-09-02 VITALS — BP 169/95 | HR 87 | Temp 98.3°F | Ht 61.0 in | Wt 176.1 lb

## 2019-09-02 DIAGNOSIS — R748 Abnormal levels of other serum enzymes: Secondary | ICD-10-CM

## 2019-09-02 DIAGNOSIS — R1011 Right upper quadrant pain: Secondary | ICD-10-CM | POA: Diagnosis not present

## 2019-09-02 DIAGNOSIS — M79671 Pain in right foot: Secondary | ICD-10-CM

## 2019-09-02 DIAGNOSIS — E782 Mixed hyperlipidemia: Secondary | ICD-10-CM | POA: Diagnosis not present

## 2019-09-02 DIAGNOSIS — M79672 Pain in left foot: Secondary | ICD-10-CM

## 2019-09-02 DIAGNOSIS — R030 Elevated blood-pressure reading, without diagnosis of hypertension: Secondary | ICD-10-CM

## 2019-09-02 DIAGNOSIS — G8929 Other chronic pain: Secondary | ICD-10-CM

## 2019-09-02 NOTE — Progress Notes (Signed)
Jessica Richmond is a 44 y.o. female who presents to  [name] at [facility]  today, 09/02/19, seeking care for the following:  The primary encounter diagnosis was Chronic RUQ pain. Diagnoses of Foot pain, bilateral, Mixed hyperlipidemia, Elevated liver enzymes, and White coat syndrome without diagnosis of hypertension were also pertinent to this visit.     ASSESSMENT & PLAN with other pertinent history/findings:   1. Chronic RUQ pain Patient complains of few years overall but worsening over the past 6 months right upper quadrant/epigastric pain, described as cramping or tightness across the upper abdomen, worse with eating fatty foods.  There is family history of gallbladder disease.  We will go ahead and get right upper quadrant ultrasound, patient did have some tenderness on exam and consistent symptoms with gallbladder issue  2. Foot pain, bilateral Patient does not consider this particularly pressing but definitely bothersome and it is limiting her ability to exercise and ways that she would enjoy more such as hiking.  We will go ahead and refer to podiatry, may consider orthotics  3. Mixed hyperlipidemia Reviewed labs, I am not too concerned about LDL around 130, but pleased that patient has made dietary modifications to work on these numbers.  Can plan to recheck in a couple of months.  4.  Elevated liver enzymes Mild, can follow  5.  Whitecoat syndrome Patient's home blood pressure readings are consistently less than 130/80     Orders Placed This Encounter  Procedures  . US Abdomen Limited RUQ #1  . Lipid panel  . COMPLETE METABOLIC PANEL WITH GFR  . Ambulatory referral to Podiatry      Follow-up instructions: Return for RECHECK PENDING ULTRASOUND RESULTS / IF WORSE OR CHANGE.       BP (!) 169/95 (BP Location: Left Arm, Patient Position: Sitting, Cuff Size: Normal)   Pulse 87   Temp 98.3 F (36.8 C) (Oral)   Ht 5'  1" (1.549 m)   Wt 176 lb 1.3 oz (79.9 kg)   BMI 33.27 kg/m   Current Meds  Medication Sig  . cetirizine (ZYRTEC) 10 MG tablet Take 10 mg by mouth daily.  Marland Kitchen levonorgestrel (MIRENA) 20 MCG/24HR IUD 1 each by Intrauterine route once.  . vitamin B-12 (CYANOCOBALAMIN) 500 MCG tablet Take 500 mcg by mouth daily.  Marland Kitchen VITAMIN D, CHOLECALCIFEROL, PO Take by mouth.    No results found for this or any previous visit (from the past 72 hour(s)).  No results found.  Depression screen [facility] 2/9 09/02/2019  Decreased Interest 0  Down, Depressed, Hopeless 0  PHQ - 2 Score 0  Altered sleeping 1  Tired, decreased energy 3  Change in appetite 2  Feeling bad or failure about yourself  0  Trouble concentrating 0  Moving slowly or fidgety/restless 0  Suicidal thoughts 0  PHQ-9 Score 6  Difficult doing work/chores Not difficult at all    GAD 7 : Generalized Anxiety Score 09/02/2019  Nervous, Anxious, on Edge 0  Control/stop worrying 0  Worry too much - different things 0  Trouble relaxing 0  Restless 0  Easily annoyed or irritable 0  Afraid - awful might happen 0  Total GAD 7 Score 0      All questions at time of visit were answered - patient instructed to contact office with any additional concerns or updates.  ER/RTC precautions were reviewed with the patient.  Please note: voice recognition software was used to produce this document,  and typos may escape review. Please contact Dr. Sheppard Coil for any needed clarifications.

## 2019-09-06 ENCOUNTER — Ambulatory Visit (INDEPENDENT_AMBULATORY_CARE_PROVIDER_SITE_OTHER): Payer: BC Managed Care – PPO

## 2019-09-06 ENCOUNTER — Other Ambulatory Visit: Payer: Self-pay

## 2019-09-06 DIAGNOSIS — K8020 Calculus of gallbladder without cholecystitis without obstruction: Secondary | ICD-10-CM

## 2019-09-06 DIAGNOSIS — G8929 Other chronic pain: Secondary | ICD-10-CM

## 2019-09-06 DIAGNOSIS — R1011 Right upper quadrant pain: Secondary | ICD-10-CM

## 2019-09-07 ENCOUNTER — Other Ambulatory Visit: Payer: Self-pay | Admitting: Podiatry

## 2019-09-07 ENCOUNTER — Encounter: Payer: Self-pay | Admitting: Podiatry

## 2019-09-07 ENCOUNTER — Ambulatory Visit: Payer: BC Managed Care – PPO | Admitting: Podiatry

## 2019-09-07 ENCOUNTER — Ambulatory Visit (INDEPENDENT_AMBULATORY_CARE_PROVIDER_SITE_OTHER): Payer: BC Managed Care – PPO

## 2019-09-07 VITALS — BP 127/78 | HR 83 | Resp 16

## 2019-09-07 DIAGNOSIS — M2012 Hallux valgus (acquired), left foot: Secondary | ICD-10-CM | POA: Diagnosis not present

## 2019-09-07 DIAGNOSIS — M2011 Hallux valgus (acquired), right foot: Secondary | ICD-10-CM | POA: Diagnosis not present

## 2019-09-07 DIAGNOSIS — M2141 Flat foot [pes planus] (acquired), right foot: Secondary | ICD-10-CM

## 2019-09-07 DIAGNOSIS — M2142 Flat foot [pes planus] (acquired), left foot: Secondary | ICD-10-CM

## 2019-09-07 DIAGNOSIS — M722 Plantar fascial fibromatosis: Secondary | ICD-10-CM

## 2019-09-08 NOTE — Progress Notes (Signed)
Subjective:  Patient ID: Jessica Richmond, female    DOB: 05-Jul-1976,  MRN: [id number] HPI Chief Complaint  Patient presents with  . Foot Pain    Arch/medial foot bilateral - "flat feet" x years,, wore inserts as a teenager, R>L, swelling medially on right, can't walk any length of time without having pain-also bunion deformities  . New Patient (Initial Visit)    44 y.o. female presents with the above complaint.   ROS: Denies fever chills nausea vomiting muscle aches pains calf pain back pain chest pain shortness of breath.  Past Medical History:  Diagnosis Date  . Abnormal Pap smear of cervix    ASCUS retested neg  . Allergic rhinitis   . Multiple food allergies    wheat, Milk, shrimp  . Vitamin B 12 deficiency    Past Surgical History:  Procedure Laterality Date  . HIP ARTHROSCOPY Left    torn labrumin  . WISDOM TOOTH EXTRACTION      Current Outpatient Medications:  .  cetirizine (ZYRTEC) 10 MG tablet, Take 10 mg by mouth daily., Disp: , Rfl:  .  fluticasone (FLONASE) 50 MCG/ACT nasal spray, Place 1 spray into both nostrils daily., Disp: , Rfl:  .  hydrochlorothiazide (MICROZIDE) 12.5 MG capsule, Take 1 capsule (12.5 mg total) by mouth daily. (Patient not taking: Reported on 05/24/2019), Disp: 30 capsule, Rfl: 11 .  levonorgestrel (MIRENA) 20 MCG/24HR IUD, 1 each by Intrauterine route once., Disp: , Rfl:  .  meclizine (ANTIVERT) 25 MG tablet, Take 1 tablet (25 mg total) by mouth 3 (three) times daily as needed for dizziness. (Patient not taking: Reported on 08/26/2016), Disp: 30 tablet, Rfl: 1 .  vitamin B-12 (CYANOCOBALAMIN) 500 MCG tablet, Take 500 mcg by mouth daily., Disp: , Rfl:  .  VITAMIN D, CHOLECALCIFEROL, PO, Take by mouth., Disp: , Rfl:   Allergies  Allergen Reactions  . Keflex [Cephalexin] Hives    hives  . Indomethacin Nausea Only, Other (See Comments) and Nausea And Vomiting    Dizziness  . Milk-Related Compounds   . Shrimp [Shellfish Allergy]   . Wheat Bran     Review of Systems Objective:   Vitals:   09/07/19 1045  BP: 127/78  Pulse: 83  Resp: 16    General: Well developed, nourished, in no acute distress, alert and oriented x3   Dermatological: Skin is warm, dry and supple bilateral. Nails x 10 are well maintained; remaining integument appears unremarkable at this time. There are no open sores, no preulcerative lesions, no rash or signs of infection present.  Vascular: Dorsalis Pedis artery and Posterior Tibial artery pedal pulses are 2/4 bilateral with immedate capillary fill time. Pedal hair growth present. No varicosities and no lower extremity edema present bilateral.   Neruologic: Grossly intact via light touch bilateral. Vibratory intact via tuning fork bilateral. Protective threshold with Semmes Wienstein monofilament intact to all pedal sites bilateral. Patellar and Achilles deep tendon reflexes 2+ bilateral. No Babinski or clonus noted bilateral.   Musculoskeletal: No gross boney pedal deformities bilateral. No pain, crepitus, or limitation noted with foot and ankle range of motion bilateral. Muscular strength 5/5 in all groups tested bilateral.  Flexible flatfoot deformity noted bilateral with mild hallux valgus deformity right foot good range of motion of the first metatarsophalangeal joint.  Gait: Unassisted, Nonantalgic.    Radiographs:  Radiographs taken today demonstrate mild pes planus no osseous abnormalities other than hallux abductovalgus deformity in the right foot with early osteoarthritic changes along with a lateral  aspect.  Joint space narrowing subchondral sclerosis is also noted in the area.  No coalitions are noted in the rear foot.  Assessment & Plan:   Assessment: Hallux abductovalgus deformity right flexible pes planus bilateral  Plan: Discussed the need for surgical intervention regarding the bunion deformity.  Also discussed a set of new orthotics she was seen by Jessica Richmond today for orthotic  molding.     Jessica Richmond, [place]

## 2019-09-12 ENCOUNTER — Encounter: Payer: Self-pay | Admitting: Osteopathic Medicine

## 2019-09-13 NOTE — Addendum Note (Signed)
Addended by: Towana Badger on: 09/13/2019 11:13 AM   Modules accepted: Orders

## 2019-09-13 NOTE — Telephone Encounter (Signed)
Provider sent to wrong queue made aware of the referral today - CF

## 2019-09-13 NOTE — Telephone Encounter (Signed)
Jessica Richmond,   Can you please look at the referral to general surgery for this patient and send her a MyChart msg with an update?  Thanks!

## 2019-09-20 ENCOUNTER — Encounter: Payer: Self-pay | Admitting: Osteopathic Medicine

## 2019-09-20 ENCOUNTER — Other Ambulatory Visit: Payer: Self-pay

## 2019-09-20 ENCOUNTER — Ambulatory Visit (INDEPENDENT_AMBULATORY_CARE_PROVIDER_SITE_OTHER): Payer: BC Managed Care – PPO

## 2019-09-20 DIAGNOSIS — R748 Abnormal levels of other serum enzymes: Secondary | ICD-10-CM

## 2019-09-20 DIAGNOSIS — K760 Fatty (change of) liver, not elsewhere classified: Secondary | ICD-10-CM | POA: Diagnosis not present

## 2019-09-20 DIAGNOSIS — K8020 Calculus of gallbladder without cholecystitis without obstruction: Secondary | ICD-10-CM | POA: Diagnosis not present

## 2019-09-20 MED ORDER — GADOBUTROL 1 MMOL/ML IV SOLN
8.0000 mL | Freq: Once | INTRAVENOUS | Status: AC | PRN
  Administered 2019-09-20: 10:00:00 8 mL via INTRAVENOUS

## 2019-09-28 ENCOUNTER — Ambulatory Visit: Payer: BC Managed Care – PPO | Admitting: Orthotics

## 2019-09-28 ENCOUNTER — Other Ambulatory Visit: Payer: Self-pay

## 2019-09-28 DIAGNOSIS — M2141 Flat foot [pes planus] (acquired), right foot: Secondary | ICD-10-CM

## 2019-09-28 DIAGNOSIS — M2011 Hallux valgus (acquired), right foot: Secondary | ICD-10-CM

## 2019-09-28 DIAGNOSIS — M722 Plantar fascial fibromatosis: Secondary | ICD-10-CM

## 2019-09-28 NOTE — Progress Notes (Signed)
Patient came in today to pick up custom made foot orthotics.  The goals were accomplished and the patient reported no dissatisfaction with said orthotics.  Patient was advised of breakin period and how to report any issues. 

## 2019-10-01 ENCOUNTER — Ambulatory Visit: Payer: Self-pay | Admitting: General Surgery

## 2019-10-01 DIAGNOSIS — D1803 Hemangioma of intra-abdominal structures: Secondary | ICD-10-CM | POA: Diagnosis not present

## 2019-10-01 DIAGNOSIS — K8020 Calculus of gallbladder without cholecystitis without obstruction: Secondary | ICD-10-CM | POA: Diagnosis not present

## 2019-10-01 NOTE — H&P (Signed)
Norval Gable Documented: 10/01/2019 1:28 PM Location: [facility] Patient #: [id number] DOB: [birth year] Single / Language: Jessica Richmond / Race: White Female  History of Present Illness Randall Hiss M. Fatisha Rabalais MD; 10/01/2019 2:02 PM) The patient is a 44 year old female who presents for evaluation of gall stones. She is referred by Dr. Sheppard Coil to discuss right upper quadrant pain. She states that she has had right upper quadrant pain at least going back since October. She had an episode where she had pretty intense pain in her right upper quadrant that lasted for several hours but then took a few more hours to completely go away. Since then she reports almost a daily dull ache in her right upper quadrant but at times she will have increases in the discomfort. It will generally get worse after eating but not all the time. When she does have a spike in discomfort the discomfort with turning last few hours the knees up. She doesn't really have any nausea or vomiting. She may get bloated at times. She denies any fevers or chills. She denies any melena hematochezia. She takes occasional NSAIDs. She hasn't really tried anything over-the-counter. She saw her primary care physician who ordered an abdominal ultrasound which I reviewed. It showed a 2.5 cm gallstone without any evidence of gallbladder wall thickening. She was also found to have a 3.7 x 3.4 x 2.8 lesion in the right hepatic lobe. She underwent an MRI of her abdomen which confirmed a benign appearing hemangioma. 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  She denies any chest pain, chest pressure, source of breath, dyspnea on exertion. She does not smoke. She had normal liver function tests in October 2020   Problem List/Past Medical Randall Hiss M. Redmond Pulling, MD; 10/01/2019 2:02 PM) HEMANGIOMA OF LIVER (D18.03) SYMPTOMATIC CHOLELITHIASIS (K80.20)  Past Surgical History (April Staton, CMA; 10/01/2019 1:28 PM) Hip Surgery  Left. Oral Surgery  Diagnostic Studies History (April Staton, [place]; 10/01/2019 1:28 PM) Colonoscopy never Mammogram within last year Pap Smear 1-5 years ago  Allergies (April Staton, CMA; 10/01/2019 1:29 PM) Keflex *CEPHALOSPORINS* Indomethacin *ANALGESICS - ANTI-INFLAMMATORY*  Medication History (April Staton, CMA; 10/01/2019 1:29 PM) No Current Medications Medications Reconciled  Social History (April Staton, CMA; 10/01/2019 1:28 PM) Alcohol use Occasional alcohol use. Caffeine use Coffee, Tea. No drug use Tobacco use Never smoker.  Family History (April Staton, [place]; 10/01/2019 1:28 PM) Alcohol Abuse Father, Sister. Arthritis Father. Depression Father, Mother, Sister. Diabetes Mellitus Father. Heart Disease Mother. Ischemic Bowel Disease Father. Migraine Headache Mother, Sister.  Pregnancy / Birth History (April Staton, [place]; 10/01/2019 1:28 PM) Age at menarche 31 years. Contraceptive History Intrauterine device. Gravida 0 Para 0 Regular periods  Other Problems Randall Hiss M. Redmond Pulling, MD; 10/01/2019 2:02 PM) Arthritis Cholelithiasis     Review of Systems (April Staton CMA; 10/01/2019 1:28 PM) General Present- Fatigue and Weight Gain. Not Present- Appetite Loss, Chills, Fever, Night Sweats and Weight Loss. Skin Not Present- Change in Wart/Mole, Dryness, Hives, Jaundice, New Lesions, Non-Healing Wounds, Rash and Ulcer. HEENT Present- Seasonal Allergies. Not Present- Earache, Hearing Loss, Hoarseness, Nose Bleed, Oral Ulcers, Ringing in the Ears, Sinus Pain, Sore Throat, Visual Disturbances, Wears glasses/contact lenses and Yellow Eyes. Respiratory Present- Snoring. Not Present- Bloody sputum, Chronic Cough, Difficulty Breathing and Wheezing. Breast Not Present- Breast Mass, Breast Pain, Nipple Discharge and Skin Changes. Cardiovascular Not Present- Chest Pain, Difficulty Breathing Lying Down, Leg Cramps, Palpitations, Rapid Heart Rate, Shortness of Breath  and Swelling of Extremities. Gastrointestinal Present-  Abdominal Pain and Bloating. Not Present- Bloody Stool, Change in Bowel Habits, Chronic diarrhea, Constipation, Difficulty Swallowing, Excessive gas, Gets full quickly at meals, Hemorrhoids, Indigestion, Nausea, Rectal Pain and Vomiting. Female Genitourinary Not Present- Frequency, Nocturia, Painful Urination, Pelvic Pain and Urgency. Musculoskeletal Not Present- Back Pain, Joint Pain, Joint Stiffness, Muscle Pain, Muscle Weakness and Swelling of Extremities. Neurological Not Present- Decreased Memory, Fainting, Headaches, Numbness, Seizures, Tingling, Tremor, Trouble walking and Weakness. Psychiatric Not Present- Anxiety, Bipolar, Change in Sleep Pattern, Depression, Fearful and Frequent crying. Endocrine Not Present- Cold Intolerance, Excessive Hunger, Hair Changes, Heat Intolerance, Hot flashes and New Diabetes. Hematology Not Present- Blood Thinners, Easy Bruising, Excessive bleeding, Gland problems, HIV and Persistent Infections.  Vitals (April Staton CMA; 10/01/2019 1:30 PM) 10/01/2019 1:29 PM Weight: 179.38 lb Height: 61in Body Surface Area: 1.8 m Body Mass Index: 33.89 kg/m  Temp.: 56F(Tympanic)  Pulse: 101 (Regular)  P.OX: 93% (Room air) BP: 148/92 (Sitting, Left Arm, Standard)        Physical Exam Randall Hiss M. Min Collymore MD; 10/01/2019 1:59 PM)  General Mental Status-Alert. General Appearance-Consistent with stated age. Hydration-Well hydrated. Voice-Normal.  Head and Neck Head-normocephalic, atraumatic with no lesions or palpable masses. Trachea-midline. Thyroid Gland Characteristics - normal size and consistency.  Eye Eyeball - Bilateral-Extraocular movements intact. Sclera/Conjunctiva - Bilateral-No scleral icterus.  ENMT Ears Pinna - Bilateral - no bony growth in lateral aspect of ear canal, no edema. Nose and Sinuses External Inspection of the Nose - symmetric, no deformities  observed.  Chest and Lung Exam Chest and lung exam reveals -quiet, even and easy respiratory effort with no use of accessory muscles and on auscultation, normal breath sounds, no adventitious sounds and normal vocal resonance. Inspection Chest Wall - Normal. Back - normal.  Breast - Did not examine.  Cardiovascular Cardiovascular examination reveals -normal heart sounds, regular rate and rhythm with no murmurs and normal pedal pulses bilaterally.  Abdomen Inspection Inspection of the abdomen reveals - No Hernias. Skin - Scar - no surgical scars. Palpation/Percussion Palpation and Percussion of the abdomen reveal - Soft, Non Tender, No Rebound tenderness, No Rigidity (guarding) and No hepatosplenomegaly. Auscultation Auscultation of the abdomen reveals - Bowel sounds normal.  Peripheral Vascular Upper Extremity Palpation - Pulses bilaterally normal.  Neurologic Neurologic evaluation reveals -alert and oriented x 3 with no impairment of recent or remote memory. Mental Status-Normal.  Neuropsychiatric The patient's mood and affect are described as -normal. Judgment and Insight-insight is appropriate concerning matters relevant to self.  Musculoskeletal Normal Exam - Left-Upper Extremity Strength Normal and Lower Extremity Strength Normal. Normal Exam - Right-Upper Extremity Strength Normal and Lower Extremity Strength Normal.  Lymphatic Head & Neck  General Head & Neck Lymphatics: Bilateral - Description - Normal. Axillary - Did not examine. Femoral & Inguinal - Did not examine.    Assessment & Plan Randall Hiss M. Delyla Sandeen MD; 10/01/2019 2:02 PM)  SYMPTOMATIC CHOLELITHIASIS (K80.20) Impression: I believe the patient's symptoms are consistent with gallbladder disease.  We discussed gallbladder disease. The patient was given Neurosurgeon. We discussed non-operative and operative management. We discussed the signs & symptoms of acute cholecystitis  I  discussed laparoscopic cholecystectomy with IOC in detail. The patient was given educational material as well as diagrams detailing the procedure. We discussed the risks and benefits of a laparoscopic cholecystectomy including, but not limited to bleeding, infection, injury to surrounding structures such as the intestine or liver, bile leak, retained gallstones, need to convert to an open procedure, prolonged diarrhea, blood clots such  as DVT, common bile duct injury, anesthesia risks, and possible need for additional procedures. We discussed the typical post-operative recovery course. I explained that the likelihood of improvement of their symptoms is good.  The patient has elected to proceed with surgery.  This patient encounter took 32 minutes today to perform the following: take history, perform exam, review outside records, interpret imaging, counsel the patient on their diagnosis and document encounter, findings & plan in the EHR  Current Plans Pt Education - Pamphlet Given - Laparoscopic Gallbladder Surgery: discussed with patient and provided information. You are being scheduled for surgery- Our schedulers will call you.  You should hear from our office's scheduling department within 5 working days about the location, date, and time of surgery. We try to make accommodations for patient's preferences in scheduling surgery, but sometimes the OR schedule or the surgeon's schedule prevents [place] from making those accommodations.  If you have not heard from our office [phone]) in 5 working days, call the office and ask for your surgeon's nurse.  If you have other questions about your diagnosis, plan, or surgery, call the office and ask for your surgeon's nurse.   HEMANGIOMA OF LIVER (D18.03) Impression: It appears that she has a benign right hepatic lobe hemangioma. I agree with radiology and getting repeat imaging in 6-12 months to make sure it is not growing. She states that her PCP has  made a note of this in her chart  Leighton Ruff. Redmond Pulling, MD, FACS General, Bariatric, & Minimally Invasive Surgery [facility], [place]

## 2019-10-01 NOTE — H&P (View-Only) (Signed)
Norval Gable Documented: 10/01/2019 1:28 PM Location: [facility] Patient #: [id number] DOB: 05/19/1976 Single / Language: Cleophus Molt / Race: White Female  History of Present Illness Randall Hiss M. Raivyn Kabler MD; 10/01/2019 2:02 PM) The patient is a 44 year old female who presents for evaluation of gall stones. She is referred by Dr. Sheppard Coil to discuss right upper quadrant pain. She states that she has had right upper quadrant pain at least going back since October. She had an episode where she had pretty intense pain in her right upper quadrant that lasted for several hours but then took a few more hours to completely go away. Since then she reports almost a daily dull ache in her right upper quadrant but at times she will have increases in the discomfort. It will generally get worse after eating but not all the time. When she does have a spike in discomfort the discomfort with turning last few hours the knees up. She doesn't really have any nausea or vomiting. She may get bloated at times. She denies any fevers or chills. She denies any melena hematochezia. She takes occasional NSAIDs. She hasn't really tried anything over-the-counter. She saw her primary care physician who ordered an abdominal ultrasound which I reviewed. It showed a 2.5 cm gallstone without any evidence of gallbladder wall thickening. She was also found to have a 3.7 x 3.4 x 2.8 lesion in the right hepatic lobe. She underwent an MRI of her abdomen which confirmed a benign appearing hemangioma. 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  She denies any chest pain, chest pressure, source of breath, dyspnea on exertion. She does not smoke. She had normal liver function tests in October 2020   Problem List/Past Medical Randall Hiss M. Redmond Pulling, MD; 10/01/2019 2:02 PM) HEMANGIOMA OF LIVER (D18.03) SYMPTOMATIC CHOLELITHIASIS (K80.20)  Past Surgical History (April Staton, CMA; 10/01/2019 1:28 PM) Hip Surgery  Left. Oral Surgery  Diagnostic Studies History (April Staton, [place]; 10/01/2019 1:28 PM) Colonoscopy never Mammogram within last year Pap Smear 1-5 years ago  Allergies (April Staton, CMA; 10/01/2019 1:29 PM) Keflex *CEPHALOSPORINS* Indomethacin *ANALGESICS - ANTI-INFLAMMATORY*  Medication History (April Staton, CMA; 10/01/2019 1:29 PM) No Current Medications Medications Reconciled  Social History (April Staton, CMA; 10/01/2019 1:28 PM) Alcohol use Occasional alcohol use. Caffeine use Coffee, Tea. No drug use Tobacco use Never smoker.  Family History (April Staton, [place]; 10/01/2019 1:28 PM) Alcohol Abuse Father, Sister. Arthritis Father. Depression Father, Mother, Sister. Diabetes Mellitus Father. Heart Disease Mother. Ischemic Bowel Disease Father. Migraine Headache Mother, Sister.  Pregnancy / Birth History (April Staton, [place]; 10/01/2019 1:28 PM) Age at menarche 20 years. Contraceptive History Intrauterine device. Gravida 0 Para 0 Regular periods  Other Problems Randall Hiss M. Redmond Pulling, MD; 10/01/2019 2:02 PM) Arthritis Cholelithiasis     Review of Systems (April Staton CMA; 10/01/2019 1:28 PM) General Present- Fatigue and Weight Gain. Not Present- Appetite Loss, Chills, Fever, Night Sweats and Weight Loss. Skin Not Present- Change in Wart/Mole, Dryness, Hives, Jaundice, New Lesions, Non-Healing Wounds, Rash and Ulcer. HEENT Present- Seasonal Allergies. Not Present- Earache, Hearing Loss, Hoarseness, Nose Bleed, Oral Ulcers, Ringing in the Ears, Sinus Pain, Sore Throat, Visual Disturbances, Wears glasses/contact lenses and Yellow Eyes. Respiratory Present- Snoring. Not Present- Bloody sputum, Chronic Cough, Difficulty Breathing and Wheezing. Breast Not Present- Breast Mass, Breast Pain, Nipple Discharge and Skin Changes. Cardiovascular Not Present- Chest Pain, Difficulty Breathing Lying Down, Leg Cramps, Palpitations, Rapid Heart Rate, Shortness of Breath  and Swelling of Extremities. Gastrointestinal Present-  Abdominal Pain and Bloating. Not Present- Bloody Stool, Change in Bowel Habits, Chronic diarrhea, Constipation, Difficulty Swallowing, Excessive gas, Gets full quickly at meals, Hemorrhoids, Indigestion, Nausea, Rectal Pain and Vomiting. Female Genitourinary Not Present- Frequency, Nocturia, Painful Urination, Pelvic Pain and Urgency. Musculoskeletal Not Present- Back Pain, Joint Pain, Joint Stiffness, Muscle Pain, Muscle Weakness and Swelling of Extremities. Neurological Not Present- Decreased Memory, Fainting, Headaches, Numbness, Seizures, Tingling, Tremor, Trouble walking and Weakness. Psychiatric Not Present- Anxiety, Bipolar, Change in Sleep Pattern, Depression, Fearful and Frequent crying. Endocrine Not Present- Cold Intolerance, Excessive Hunger, Hair Changes, Heat Intolerance, Hot flashes and New Diabetes. Hematology Not Present- Blood Thinners, Easy Bruising, Excessive bleeding, Gland problems, HIV and Persistent Infections.  Vitals (April Staton CMA; 10/01/2019 1:30 PM) 10/01/2019 1:29 PM Weight: 179.38 lb Height: 61in Body Surface Area: 1.8 m Body Mass Index: 33.89 kg/m  Temp.: 71F(Tympanic)  Pulse: 101 (Regular)  P.OX: 93% (Room air) BP: 148/92 (Sitting, Left Arm, Standard)        Physical Exam Randall Hiss M. Julianna Vanwagner MD; 10/01/2019 1:59 PM)  General Mental Status-Alert. General Appearance-Consistent with stated age. Hydration-Well hydrated. Voice-Normal.  Head and Neck Head-normocephalic, atraumatic with no lesions or palpable masses. Trachea-midline. Thyroid Gland Characteristics - normal size and consistency.  Eye Eyeball - Bilateral-Extraocular movements intact. Sclera/Conjunctiva - Bilateral-No scleral icterus.  ENMT Ears Pinna - Bilateral - no bony growth in lateral aspect of ear canal, no edema. Nose and Sinuses External Inspection of the Nose - symmetric, no deformities  observed.  Chest and Lung Exam Chest and lung exam reveals -quiet, even and easy respiratory effort with no use of accessory muscles and on auscultation, normal breath sounds, no adventitious sounds and normal vocal resonance. Inspection Chest Wall - Normal. Back - normal.  Breast - Did not examine.  Cardiovascular Cardiovascular examination reveals -normal heart sounds, regular rate and rhythm with no murmurs and normal pedal pulses bilaterally.  Abdomen Inspection Inspection of the abdomen reveals - No Hernias. Skin - Scar - no surgical scars. Palpation/Percussion Palpation and Percussion of the abdomen reveal - Soft, Non Tender, No Rebound tenderness, No Rigidity (guarding) and No hepatosplenomegaly. Auscultation Auscultation of the abdomen reveals - Bowel sounds normal.  Peripheral Vascular Upper Extremity Palpation - Pulses bilaterally normal.  Neurologic Neurologic evaluation reveals -alert and oriented x 3 with no impairment of recent or remote memory. Mental Status-Normal.  Neuropsychiatric The patient's mood and affect are described as -normal. Judgment and Insight-insight is appropriate concerning matters relevant to self.  Musculoskeletal Normal Exam - Left-Upper Extremity Strength Normal and Lower Extremity Strength Normal. Normal Exam - Right-Upper Extremity Strength Normal and Lower Extremity Strength Normal.  Lymphatic Head & Neck  General Head & Neck Lymphatics: Bilateral - Description - Normal. Axillary - Did not examine. Femoral & Inguinal - Did not examine.    Assessment & Plan Randall Hiss M. Tanush Drees MD; 10/01/2019 2:02 PM)  SYMPTOMATIC CHOLELITHIASIS (K80.20) Impression: I believe the patient's symptoms are consistent with gallbladder disease.  We discussed gallbladder disease. The patient was given Neurosurgeon. We discussed non-operative and operative management. We discussed the signs & symptoms of acute cholecystitis  I  discussed laparoscopic cholecystectomy with IOC in detail. The patient was given educational material as well as diagrams detailing the procedure. We discussed the risks and benefits of a laparoscopic cholecystectomy including, but not limited to bleeding, infection, injury to surrounding structures such as the intestine or liver, bile leak, retained gallstones, need to convert to an open procedure, prolonged diarrhea, blood clots such  as DVT, common bile duct injury, anesthesia risks, and possible need for additional procedures. We discussed the typical post-operative recovery course. I explained that the likelihood of improvement of their symptoms is good.  The patient has elected to proceed with surgery.  This patient encounter took 32 minutes today to perform the following: take history, perform exam, review outside records, interpret imaging, counsel the patient on their diagnosis and document encounter, findings & plan in the EHR  Current Plans Pt Education - Pamphlet Given - Laparoscopic Gallbladder Surgery: discussed with patient and provided information. You are being scheduled for surgery- Our schedulers will call you.  You should hear from our office's scheduling department within 5 working days about the location, date, and time of surgery. We try to make accommodations for patient's preferences in scheduling surgery, but sometimes the OR schedule or the surgeon's schedule prevents [place] from making those accommodations.  If you have not heard from our office [phone]) in 5 working days, call the office and ask for your surgeon's nurse.  If you have other questions about your diagnosis, plan, or surgery, call the office and ask for your surgeon's nurse.   HEMANGIOMA OF LIVER (D18.03) Impression: It appears that she has a benign right hepatic lobe hemangioma. I agree with radiology and getting repeat imaging in 6-12 months to make sure it is not growing. She states that her PCP has  made a note of this in her chart  Leighton Ruff. Redmond Pulling, MD, FACS General, Bariatric, & Minimally Invasive Surgery [facility], [place]

## 2019-10-12 NOTE — Patient Instructions (Signed)
DUE TO COVID-19 ONLY ONE VISITOR IS ALLOWED TO COME WITH YOU AND STAY IN THE WAITING ROOM ONLY DURING PRE OP AND PROCEDURE DAY OF SURGERY. THE 1 VISITOR MAY VISIT WITH YOU AFTER SURGERY IN YOUR PRIVATE ROOM DURING VISITING HOURS ONLY!  YOU NEED TO HAVE A COVID 19 TEST ON: 10/14/19 @ 9:00 am, THIS TEST MUST BE DONE BEFORE SURGERY, COME  [place], [place] , [address].  ([place]) ONCE YOUR COVID TEST IS COMPLETED, PLEASE BEGIN THE QUARANTINE INSTRUCTIONS AS OUTLINED IN YOUR HANDOUT.                Jessica Richmond     Your procedure is scheduled on: 10/18/19   Report to [facility] Main  Entrance   Report to admitting at: 8:00 AM     Call this number if you have problems the morning of surgery [phone]    Remember:    [place], NO [place].     Take these medicines the morning of surgery with A SIP OF WATER: cetirizine.                                 You may not have any metal on your body including hair pins and              piercings  Do not wear jewelry, make-up, lotions, powders or perfumes, deodorant             Do not wear nail polish on your fingernails.  Do not shave  48 hours prior to surgery.                 Do not bring valuables to the hospital. [place].  Contacts, dentures or bridgework may not be worn into surgery.  Leave suitcase in the car. After surgery it may be brought to your room.     Patients discharged the day of surgery will not be allowed to drive home. IF YOU ARE HAVING SURGERY AND GOING HOME THE SAME DAY, YOU MUST HAVE AN ADULT TO DRIVE YOU HOME AND BE WITH YOU FOR 24 HOURS. YOU MAY GO HOME BY TAXI OR UBER OR ORTHERWISE, BUT AN ADULT MUST ACCOMPANY YOU HOME AND STAY WITH YOU FOR 24 HOURS.  Name and phone number of your driver:  Special Instructions: N/A              Please read over the  following fact sheets you were given: _____________________________________________________________________             NO SOLID FOOD AFTER MIDNIGHT THE NIGHT PRIOR TO SURGERY. NOTHING BY MOUTH EXCEPT CLEAR LIQUIDS UNTIL: 7:00 am . PLEASE FINISH ENSURE DRINK PER SURGEON ORDER  WHICH NEEDS TO BE COMPLETED AT: 7:00 am .   CLEAR LIQUID DIET   Foods Allowed                                                                     Foods Excluded  Coffee and tea, regular and decaf                             liquids that you cannot  Plain Jell-O any favor except red or purple                                           see through such as: Fruit ices (not with fruit pulp)                                     milk, soups, orange juice  Iced Popsicles                                    All solid food Carbonated beverages, regular and diet                                    Cranberry, grape and apple juices Sports drinks like Gatorade Lightly seasoned clear broth or consume(fat free) Sugar, honey syrup  Sample Menu Breakfast                                Lunch                                     Supper Cranberry juice                    Beef broth                            Chicken broth Jell-O                                     Grape juice                           Apple juice Coffee or tea                        Jell-O                                      Popsicle                                                Coffee or tea                        Coffee or tea  _____________________________________________________________________  [facility] - Preparing for Surgery Before surgery, you can play an important role.  Because skin is not sterile, your skin needs to be  as free of germs as possible.  You can reduce the number of germs on your skin by washing with CHG (chlorahexidine gluconate) soap before surgery.  CHG is an antiseptic cleaner which kills germs and bonds with the skin to continue  killing germs even after washing. Please DO NOT use if you have an allergy to CHG or antibacterial soaps.  If your skin becomes reddened/irritated stop using the CHG and inform your nurse when you arrive at Short Stay. Do not shave (including legs and underarms) for at least 48 hours prior to the first CHG shower.  You may shave your face/neck. Please follow these instructions carefully:  1.  Shower with CHG Soap the night before surgery and the  morning of Surgery.  2.  If you choose to wash your hair, wash your hair first as usual with your  normal  shampoo.  3.  After you shampoo, rinse your hair and body thoroughly to remove the  shampoo.                           4.  Use CHG as you would any other liquid soap.  You can apply chg directly  to the skin and wash                       Gently with a scrungie or clean washcloth.  5.  Apply the CHG Soap to your body ONLY FROM THE NECK DOWN.   Do not use on face/ open                           Wound or open sores. Avoid contact with eyes, ears mouth and genitals (private parts).                       Wash face,  Genitals (private parts) with your normal soap.             6.  Wash thoroughly, paying special attention to the area where your surgery  will be performed.  7.  Thoroughly rinse your body with warm water from the neck down.  8.  DO NOT shower/wash with your normal soap after using and rinsing off  the CHG Soap.                9.  Pat yourself dry with a clean towel.            10.  Wear clean pajamas.            11.  Place clean sheets on your bed the night of your first shower and do not  sleep with pets. Day of Surgery : Do not apply any lotions/deodorants the morning of surgery.  Please wear clean clothes to the hospital/surgery center.  FAILURE TO FOLLOW THESE INSTRUCTIONS MAY RESULT IN THE CANCELLATION OF YOUR SURGERY PATIENT SIGNATURE_________________________________  NURSE  SIGNATURE__________________________________  ________________________________________________________________________

## 2019-10-13 ENCOUNTER — Encounter (HOSPITAL_COMMUNITY): Payer: Self-pay

## 2019-10-13 ENCOUNTER — Encounter (HOSPITAL_COMMUNITY)
Admission: RE | Admit: 2019-10-13 | Discharge: 2019-10-13 | Disposition: A | Discharge status: Home / Self Care | Payer: BC Managed Care – PPO | Source: Ambulatory Visit | Attending: General Surgery | Admitting: General Surgery

## 2019-10-13 ENCOUNTER — Other Ambulatory Visit: Payer: Self-pay

## 2019-10-13 DIAGNOSIS — Z01812 Encounter for preprocedural laboratory examination: Secondary | ICD-10-CM | POA: Insufficient documentation

## 2019-10-13 HISTORY — DX: Pneumonia, unspecified organism: J18.9

## 2019-10-13 HISTORY — DX: Personal history of urinary calculi: Z87.442

## 2019-10-13 HISTORY — DX: Hemangioma unspecified site: D18.00

## 2019-10-13 LAB — CBC WITH DIFFERENTIAL/PLATELET
Abs Immature Granulocytes: 0.01 10*3/uL (ref 0.00–0.07)
Basophils Absolute: 0 10*3/uL (ref 0.0–0.1)
Basophils Relative: 1 %
Eosinophils Absolute: 0.1 10*3/uL (ref 0.0–0.5)
Eosinophils Relative: 1 %
HCT: 41.8 % (ref 36.0–46.0)
Hemoglobin: 13.9 g/dL (ref 12.0–15.0)
Immature Granulocytes: 0 %
Lymphocytes Relative: 36 %
Lymphs Abs: 2.8 10*3/uL (ref 0.7–4.0)
MCH: 29.8 pg (ref 26.0–34.0)
MCHC: 33.3 g/dL (ref 30.0–36.0)
MCV: 89.7 fL (ref 80.0–100.0)
Monocytes Absolute: 0.4 10*3/uL (ref 0.1–1.0)
Monocytes Relative: 5 %
Neutro Abs: 4.5 10*3/uL (ref 1.7–7.7)
Neutrophils Relative %: 57 %
Platelets: 290 10*3/uL (ref 150–400)
RBC: 4.66 MIL/uL (ref 3.87–5.11)
RDW: 11.7 % (ref 11.5–15.5)
WBC: 7.9 10*3/uL (ref 4.0–10.5)
nRBC: 0 % (ref 0.0–0.2)

## 2019-10-13 LAB — COMPREHENSIVE METABOLIC PANEL
ALT: 22 U/L (ref 0–44)
AST: 22 U/L (ref 15–41)
Albumin: 4.4 g/dL (ref 3.5–5.0)
Alkaline Phosphatase: 100 U/L (ref 38–126)
Anion gap: 9 (ref 5–15)
BUN: 14 mg/dL (ref 6–20)
CO2: 26 mmol/L (ref 22–32)
Calcium: 9.4 mg/dL (ref 8.9–10.3)
Chloride: 106 mmol/L (ref 98–111)
Creatinine, Ser: 0.86 mg/dL (ref 0.44–1.00)
GFR calc Af Amer: >60 mL/min (ref >60)
GFR calc non Af Amer: >60 mL/min (ref >60)
Glucose, Bld: 94 mg/dL (ref 70–99)
Potassium: 4 mmol/L (ref 3.5–5.1)
Sodium: 141 mmol/L (ref 135–145)
Total Bilirubin: 0.7 mg/dL (ref 0.3–1.2)
Total Protein: 7.2 g/dL (ref 6.5–8.1)

## 2019-10-13 NOTE — Progress Notes (Signed)
PCP - NATHALIE ALEXANDER. Lov: 09/07/19 Cardiologist -   Chest x-ray -  EKG -  Stress Test -  ECHO -  Cardiac Cath -   Sleep Study -  CPAP -   Fasting Blood Sugar -  Checks Blood Sugar _____ times a day  Blood Thinner Instructions: Aspirin Instructions: Last Dose:  Anesthesia review:   Patient denies shortness of breath, fever, cough and chest pain at PAT appointment   Patient verbalized understanding of instructions that were given to them at the PAT appointment. Patient was also instructed that they will need to review over the PAT instructions again at home before surgery.

## 2019-10-14 ENCOUNTER — Other Ambulatory Visit (HOSPITAL_COMMUNITY)
Admission: RE | Admit: 2019-10-14 | Discharge: 2019-10-14 | Disposition: A | Discharge status: Home / Self Care | Payer: BC Managed Care – PPO | Source: Ambulatory Visit | Attending: General Surgery | Admitting: General Surgery

## 2019-10-14 DIAGNOSIS — Z20822 Contact with and (suspected) exposure to covid-19: Secondary | ICD-10-CM | POA: Diagnosis not present

## 2019-10-14 DIAGNOSIS — Z01812 Encounter for preprocedural laboratory examination: Secondary | ICD-10-CM | POA: Insufficient documentation

## 2019-10-14 LAB — SARS CORONAVIRUS 2 (TAT 6-24 HRS): SARS Coronavirus 2: NEGATIVE

## 2019-10-17 MED ORDER — GENTAMICIN SULFATE 40 MG/ML IJ SOLN
5.0000 mg/kg | INTRAVENOUS | Status: AC
  Administered 2019-10-18: 310 mg via INTRAVENOUS
  Filled 2019-10-17: qty 7.75

## 2019-10-18 ENCOUNTER — Ambulatory Visit (HOSPITAL_COMMUNITY): Payer: BC Managed Care – PPO | Admitting: Physician Assistant

## 2019-10-18 ENCOUNTER — Encounter (HOSPITAL_COMMUNITY): Payer: Self-pay | Admitting: General Surgery

## 2019-10-18 ENCOUNTER — Ambulatory Visit (HOSPITAL_COMMUNITY)
Admission: RE | Admit: 2019-10-18 | Discharge: 2019-10-18 | Disposition: A | Discharge status: Home / Self Care | Payer: BC Managed Care – PPO | Source: Ambulatory Visit | Attending: General Surgery | Admitting: General Surgery

## 2019-10-18 ENCOUNTER — Ambulatory Visit (HOSPITAL_COMMUNITY): Payer: BC Managed Care – PPO | Admitting: Certified Registered Nurse Anesthetist

## 2019-10-18 ENCOUNTER — Encounter (HOSPITAL_COMMUNITY)
Admission: RE | Disposition: A | Discharge status: Home / Self Care | Payer: Self-pay | Source: Ambulatory Visit | Attending: General Surgery

## 2019-10-18 DIAGNOSIS — M1990 Unspecified osteoarthritis, unspecified site: Secondary | ICD-10-CM | POA: Insufficient documentation

## 2019-10-18 DIAGNOSIS — D519 Vitamin B12 deficiency anemia, unspecified: Secondary | ICD-10-CM | POA: Diagnosis not present

## 2019-10-18 DIAGNOSIS — K8010 Calculus of gallbladder with chronic cholecystitis without obstruction: Secondary | ICD-10-CM | POA: Diagnosis not present

## 2019-10-18 DIAGNOSIS — Z419 Encounter for procedure for purposes other than remedying health state, unspecified: Secondary | ICD-10-CM

## 2019-10-18 DIAGNOSIS — I10 Essential (primary) hypertension: Secondary | ICD-10-CM | POA: Diagnosis not present

## 2019-10-18 DIAGNOSIS — K8020 Calculus of gallbladder without cholecystitis without obstruction: Secondary | ICD-10-CM | POA: Diagnosis not present

## 2019-10-18 DIAGNOSIS — E782 Mixed hyperlipidemia: Secondary | ICD-10-CM | POA: Diagnosis not present

## 2019-10-18 HISTORY — PX: CHOLECYSTECTOMY: SHX55

## 2019-10-18 LAB — PREGNANCY, URINE: Preg Test, Ur: NEGATIVE

## 2019-10-18 SURGERY — LAPAROSCOPIC CHOLECYSTECTOMY
Anesthesia: General | Site: Abdomen

## 2019-10-18 MED ORDER — ENSURE PRE-SURGERY PO LIQD
296.0000 mL | Freq: Once | ORAL | Status: DC
  Filled 2019-10-18: qty 296

## 2019-10-18 MED ORDER — ACETAMINOPHEN 500 MG PO TABS
ORAL_TABLET | ORAL | Status: AC
  Administered 2019-10-18: 1000 mg via ORAL
  Filled 2019-10-18: qty 2

## 2019-10-18 MED ORDER — KETOROLAC TROMETHAMINE 30 MG/ML IJ SOLN: 30.0000 mg | Freq: Once | INTRAMUSCULAR | Status: DC | PRN

## 2019-10-18 MED ORDER — KETOROLAC TROMETHAMINE 30 MG/ML IJ SOLN
INTRAMUSCULAR | Status: AC
  Filled 2019-10-18: qty 1

## 2019-10-18 MED ORDER — DEXAMETHASONE SODIUM PHOSPHATE 10 MG/ML IJ SOLN
INTRAMUSCULAR | Status: AC
  Filled 2019-10-18: qty 1

## 2019-10-18 MED ORDER — MIDAZOLAM HCL 2 MG/2ML IJ SOLN
INTRAMUSCULAR | Status: AC
  Filled 2019-10-18: qty 2

## 2019-10-18 MED ORDER — CLINDAMYCIN PHOSPHATE 900 MG/50ML IV SOLN
900.0000 mg | INTRAVENOUS | Status: AC
  Administered 2019-10-18: 900 mg via INTRAVENOUS

## 2019-10-18 MED ORDER — LIDOCAINE 2% (20 MG/ML) 5 ML SYRINGE
INTRAMUSCULAR | Status: AC
  Filled 2019-10-18: qty 5

## 2019-10-18 MED ORDER — LIDOCAINE HCL (CARDIAC) PF 100 MG/5ML IV SOSY
PREFILLED_SYRINGE | INTRAVENOUS | Status: DC | PRN
  Administered 2019-10-18: 100 mg via INTRAVENOUS

## 2019-10-18 MED ORDER — PROPOFOL 10 MG/ML IV BOLUS
INTRAVENOUS | Status: DC | PRN
  Administered 2019-10-18: 150 mg via INTRAVENOUS

## 2019-10-18 MED ORDER — CLINDAMYCIN PHOSPHATE 900 MG/50ML IV SOLN
INTRAVENOUS | Status: AC
  Filled 2019-10-18: qty 50

## 2019-10-18 MED ORDER — OXYCODONE HCL 5 MG/5ML PO SOLN: 5.0000 mg | Freq: Once | ORAL | Status: DC | PRN

## 2019-10-18 MED ORDER — ROCURONIUM BROMIDE 10 MG/ML (PF) SYRINGE
PREFILLED_SYRINGE | INTRAVENOUS | Status: AC
  Filled 2019-10-18: qty 10

## 2019-10-18 MED ORDER — CHLORHEXIDINE GLUCONATE CLOTH 2 % EX PADS: 6.0000 | MEDICATED_PAD | Freq: Once | CUTANEOUS | Status: DC

## 2019-10-18 MED ORDER — OXYCODONE HCL 5 MG PO TABS: 5.0000 mg | ORAL_TABLET | Freq: Four times a day (QID) | ORAL | 0 refills | Status: DC | PRN

## 2019-10-18 MED ORDER — LACTATED RINGERS IR SOLN
Status: DC | PRN
  Administered 2019-10-18: 1000 mL

## 2019-10-18 MED ORDER — PHENYLEPHRINE 40 MCG/ML (10ML) SYRINGE FOR IV PUSH (FOR BLOOD PRESSURE SUPPORT)
PREFILLED_SYRINGE | INTRAVENOUS | Status: AC
  Filled 2019-10-18: qty 10

## 2019-10-18 MED ORDER — GABAPENTIN 300 MG PO CAPS
ORAL_CAPSULE | ORAL | Status: AC
  Administered 2019-10-18: 300 mg via ORAL
  Filled 2019-10-18: qty 1

## 2019-10-18 MED ORDER — KETOROLAC TROMETHAMINE 30 MG/ML IJ SOLN
INTRAMUSCULAR | Status: DC | PRN
  Administered 2019-10-18: 30 mg via INTRAVENOUS

## 2019-10-18 MED ORDER — DEXAMETHASONE SODIUM PHOSPHATE 10 MG/ML IJ SOLN
INTRAMUSCULAR | Status: DC | PRN
  Administered 2019-10-18: 4 mg via INTRAVENOUS

## 2019-10-18 MED ORDER — ACETAMINOPHEN 325 MG PO TABS: 325.0000 mg | ORAL_TABLET | ORAL | Status: DC | PRN

## 2019-10-18 MED ORDER — ONDANSETRON HCL 4 MG/2ML IJ SOLN: 4.0000 mg | Freq: Once | INTRAMUSCULAR | Status: AC

## 2019-10-18 MED ORDER — OXYCODONE HCL 5 MG PO TABS: 5.0000 mg | ORAL_TABLET | Freq: Once | ORAL | Status: DC | PRN

## 2019-10-18 MED ORDER — MIDAZOLAM HCL 2 MG/2ML IJ SOLN
INTRAMUSCULAR | Status: DC | PRN
  Administered 2019-10-18: 2 mg via INTRAVENOUS

## 2019-10-18 MED ORDER — ACETAMINOPHEN 500 MG PO TABS: 1000.0000 mg | ORAL_TABLET | Freq: Three times a day (TID) | ORAL | 0 refills | Status: AC

## 2019-10-18 MED ORDER — SUGAMMADEX SODIUM 200 MG/2ML IV SOLN
INTRAVENOUS | Status: DC | PRN
  Administered 2019-10-18: 170 mg via INTRAVENOUS

## 2019-10-18 MED ORDER — ONDANSETRON HCL 4 MG/2ML IJ SOLN
INTRAMUSCULAR | Status: AC
  Administered 2019-10-18: 4 mg via INTRAVENOUS
  Filled 2019-10-18: qty 2

## 2019-10-18 MED ORDER — FENTANYL CITRATE (PF) 100 MCG/2ML IJ SOLN: 25.0000 ug | INTRAMUSCULAR | Status: DC | PRN

## 2019-10-18 MED ORDER — PROPOFOL 10 MG/ML IV BOLUS
INTRAVENOUS | Status: AC
  Filled 2019-10-18: qty 20

## 2019-10-18 MED ORDER — BUPIVACAINE HCL (PF) 0.25 % IJ SOLN
INTRAMUSCULAR | Status: AC
  Filled 2019-10-18: qty 30

## 2019-10-18 MED ORDER — ONDANSETRON HCL 4 MG/2ML IJ SOLN
INTRAMUSCULAR | Status: DC | PRN
  Administered 2019-10-18: 4 mg via INTRAVENOUS

## 2019-10-18 MED ORDER — ONDANSETRON HCL 4 MG/2ML IJ SOLN
INTRAMUSCULAR | Status: AC
  Filled 2019-10-18: qty 2

## 2019-10-18 MED ORDER — ROCURONIUM BROMIDE 10 MG/ML (PF) SYRINGE
PREFILLED_SYRINGE | INTRAVENOUS | Status: DC | PRN
  Administered 2019-10-18: 60 mg via INTRAVENOUS
  Administered 2019-10-18: 10 mg via INTRAVENOUS

## 2019-10-18 MED ORDER — ONDANSETRON HCL 4 MG/2ML IJ SOLN
4.0000 mg | Freq: Once | INTRAMUSCULAR | Status: AC | PRN
  Administered 2019-10-18: 4 mg via INTRAVENOUS

## 2019-10-18 MED ORDER — PHENYLEPHRINE 40 MCG/ML (10ML) SYRINGE FOR IV PUSH (FOR BLOOD PRESSURE SUPPORT)
PREFILLED_SYRINGE | INTRAVENOUS | Status: DC | PRN
  Administered 2019-10-18 (×2): 80 ug via INTRAVENOUS

## 2019-10-18 MED ORDER — ACETAMINOPHEN 500 MG PO TABS: 1000.0000 mg | ORAL_TABLET | ORAL | Status: AC

## 2019-10-18 MED ORDER — BUPIVACAINE HCL (PF) 0.25 % IJ SOLN
INTRAMUSCULAR | Status: DC | PRN
  Administered 2019-10-18: 30 mL

## 2019-10-18 MED ORDER — FENTANYL CITRATE (PF) 250 MCG/5ML IJ SOLN
INTRAMUSCULAR | Status: DC | PRN
  Administered 2019-10-18 (×2): 25 ug via INTRAVENOUS
  Administered 2019-10-18: 50 ug via INTRAVENOUS
  Administered 2019-10-18: 150 ug via INTRAVENOUS

## 2019-10-18 MED ORDER — 0.9 % SODIUM CHLORIDE (POUR BTL) OPTIME
TOPICAL | Status: DC | PRN
  Administered 2019-10-18: 1000 mL

## 2019-10-18 MED ORDER — FENTANYL CITRATE (PF) 250 MCG/5ML IJ SOLN
INTRAMUSCULAR | Status: AC
  Filled 2019-10-18: qty 5

## 2019-10-18 MED ORDER — GABAPENTIN 300 MG PO CAPS: 300.0000 mg | ORAL_CAPSULE | ORAL | Status: AC

## 2019-10-18 MED ORDER — MEPERIDINE HCL 50 MG/ML IJ SOLN: 6.2500 mg | INTRAMUSCULAR | Status: DC | PRN

## 2019-10-18 MED ORDER — LACTATED RINGERS IV SOLN: INTRAVENOUS | Status: DC

## 2019-10-18 MED ORDER — ACETAMINOPHEN 160 MG/5ML PO SOLN: 325.0000 mg | ORAL | Status: DC | PRN

## 2019-10-18 SURGICAL SUPPLY — 50 items
APPLICATOR ARISTA FLEXITIP XL (MISCELLANEOUS) IMPLANT
APPLIER CLIP 5 13 M/L LIGAMAX5 (MISCELLANEOUS) ×3
APPLIER CLIP ROT 10 11.4 M/L (STAPLE)
BENZOIN TINCTURE PRP APPL 2/3 (GAUZE/BANDAGES/DRESSINGS) ×3 IMPLANT
BNDG ADH 1X3 SHEER STRL LF (GAUZE/BANDAGES/DRESSINGS) ×3 IMPLANT
CABLE HIGH FREQUENCY MONO STRZ (ELECTRODE) ×3 IMPLANT
CHLORAPREP W/TINT 26 (MISCELLANEOUS) ×3 IMPLANT
CLIP APPLIE 5 13 M/L LIGAMAX5 (MISCELLANEOUS) ×1 IMPLANT
CLIP APPLIE ROT 10 11.4 M/L (STAPLE) IMPLANT
CLIP VESOLOCK MED LG 6/CT (CLIP) IMPLANT
CLOSURE WOUND 1/2 X4 (GAUZE/BANDAGES/DRESSINGS) ×1
COVER MAYO STAND STRL (DRAPES) IMPLANT
COVER SURGICAL LIGHT HANDLE (MISCELLANEOUS) ×3 IMPLANT
COVER WAND RF STERILE (DRAPES) IMPLANT
DECANTER SPIKE VIAL GLASS SM (MISCELLANEOUS) ×3 IMPLANT
DERMABOND ADVANCED (GAUZE/BANDAGES/DRESSINGS)
DERMABOND ADVANCED .7 DNX12 (GAUZE/BANDAGES/DRESSINGS) IMPLANT
DRAPE C-ARM 42X120 X-RAY (DRAPES) IMPLANT
DRSG TEGADERM 2-3/8X2-3/4 SM (GAUZE/BANDAGES/DRESSINGS) ×6 IMPLANT
ELECT REM PT RETURN 15FT ADLT (MISCELLANEOUS) ×3 IMPLANT
GAUZE SPONGE 2X2 8PLY STRL LF (GAUZE/BANDAGES/DRESSINGS) IMPLANT
GLOVE BIO SURGEON STRL SZ7.5 (GLOVE) ×3 IMPLANT
GLOVE INDICATOR 8.0 STRL GRN (GLOVE) ×3 IMPLANT
GOWN STRL REUS W/TWL XL LVL3 (GOWN DISPOSABLE) ×9 IMPLANT
GRASPER SUT TROCAR 14GX15 (MISCELLANEOUS) ×3 IMPLANT
HEMOSTAT ARISTA ABSORB 3G PWDR (HEMOSTASIS) IMPLANT
HEMOSTAT SNOW SURGICEL 2X4 (HEMOSTASIS) ×3 IMPLANT
KIT BASIN OR (CUSTOM PROCEDURE TRAY) ×3 IMPLANT
KIT TURNOVER KIT A (KITS) IMPLANT
L-HOOK LAP DISP 36CM (ELECTROSURGICAL)
LHOOK LAP DISP 36CM (ELECTROSURGICAL) IMPLANT
POUCH RETRIEVAL ECOSAC 10 (ENDOMECHANICALS) ×1 IMPLANT
POUCH RETRIEVAL ECOSAC 10MM (ENDOMECHANICALS) ×2
SCISSORS LAP 5X35 DISP (ENDOMECHANICALS) ×3 IMPLANT
SET CHOLANGIOGRAPH MIX (MISCELLANEOUS) IMPLANT
SET IRRIG TUBING LAPAROSCOPIC (IRRIGATION / IRRIGATOR) ×3 IMPLANT
SET TUBE SMOKE EVAC HIGH FLOW (TUBING) ×3 IMPLANT
SLEEVE XCEL OPT CAN 5 100 (ENDOMECHANICALS) ×6 IMPLANT
SPONGE GAUZE 2X2 STER 10/PKG (GAUZE/BANDAGES/DRESSINGS)
STRIP CLOSURE SKIN 1/2X4 (GAUZE/BANDAGES/DRESSINGS) ×2 IMPLANT
SUT MNCRL AB 4-0 PS2 18 (SUTURE) ×3 IMPLANT
SUT VIC AB 0 UR5 27 (SUTURE) ×3 IMPLANT
SUT VICRYL 0 TIES 12 18 (SUTURE) IMPLANT
SUT VICRYL 0 UR6 27IN ABS (SUTURE) IMPLANT
TOWEL OR 17X26 10 PK STRL BLUE (TOWEL DISPOSABLE) ×3 IMPLANT
TOWEL OR NON WOVEN STRL DISP B (DISPOSABLE) ×3 IMPLANT
TRAY LAPAROSCOPIC (CUSTOM PROCEDURE TRAY) ×3 IMPLANT
TROCAR BLADELESS OPT 5 100 (ENDOMECHANICALS) ×3 IMPLANT
TROCAR XCEL BLUNT TIP 100MML (ENDOMECHANICALS) IMPLANT
TROCAR XCEL NON-BLD 11X100MML (ENDOMECHANICALS) IMPLANT

## 2019-10-18 NOTE — Op Note (Signed)
Airalynn Saxena [id number] 1975-10-28 10/18/2019  Laparoscopic Cholecystectomy Procedure Note  Indications: This patient presents with symptomatic gallbladder disease and will undergo laparoscopic cholecystectomy.  Pre-operative Diagnosis: symptomatic cholelithiasis  Post-operative Diagnosis: same  Surgeon: Greer Pickerel MD FACS  Assistants: Carlena Hurl PA-C  Anesthesia: General endotracheal anesthesia  Procedure Details  The patient was seen again in the Holding Room. The risks, benefits, complications, treatment options, and expected outcomes were discussed with the patient. The possibilities of reaction to medication, pulmonary aspiration, perforation of viscus, bleeding, recurrent infection, finding a normal gallbladder, the need for additional procedures, failure to diagnose a condition, the possible need to convert to an open procedure, and creating a complication requiring transfusion or operation were discussed with the patient. The likelihood of improving the patient's symptoms with return to their baseline status is good.  The patient and/or family concurred with the proposed plan, giving informed consent. The site of surgery properly noted. The patient was taken to Operating Room, identified as Ashonda Pandey and the procedure verified as Laparoscopic Cholecystectomy with Intraoperative Cholangiogram. A Time Out was held and the above information confirmed. Antibiotic prophylaxis was administered.   Prior to the induction of general anesthesia, antibiotic prophylaxis was administered. General endotracheal anesthesia was then administered and tolerated well. After the induction, the abdomen was prepped with Chloraprep and draped in the sterile fashion. The patient was positioned in the supine position.  Local anesthetic agent was injected into the skin near the umbilicus and an incision made. We dissected down to the abdominal fascia with blunt dissection.  The fascia was incised vertically  and we entered the peritoneal cavity bluntly.  A pursestring suture of 0-Vicryl was placed around the fascial opening.  The Hasson cannula was inserted and secured with the stay suture.  Pneumoperitoneum was then created with CO2 and tolerated well without any adverse changes in the patient's vital signs. An 5-mm port was placed in the subxiphoid position.  Two 5-mm ports were placed in the right upper quadrant. All skin incisions were infiltrated with a local anesthetic agent before making the incision and placing the trocars.   We positioned the patient in reverse Trendelenburg, tilted slightly to the patient's left.  The gallbladder was identified, the fundus grasped and retracted cephalad. Adhesions were lysed bluntly and with the electrocautery where indicated, taking care not to injure any adjacent organs or viscus. The infundibulum was grasped and retracted laterally, exposing the peritoneum overlying the triangle of Calot. This was then divided and exposed in a blunt fashion. A critical view of the cystic duct and cystic artery was obtained.  The cystic duct was clearly identified and bluntly dissected circumferentially. The cystic duct was ligated with a clip distally.   An incision was made in the cystic duct and the [facility] cholangiogram catheter was attempted to be introduced. Despite several attempts I couldn't get the catheter into the cystic duct. So I decided not to proceed with the cholangiogram. Her common bile duct & LFTS were normal . The catheter was then removed.   The cystic duct was then ligated with clips and divided. The cystic artery which had been identified & dissected free was ligated with clips and divided as well.   The gallbladder was dissected from the liver bed in retrograde fashion with the electrocautery. The gallbladder was removed and placed in an Ecco sac.  The gallbladder and Ecco sac were then removed through the umbilical port site. The liver bed was irrigated and  inspected. Hemostasis was achieved  with the electrocautery. Copious irrigation was utilized and was repeatedly aspirated until clear.  I did elect to place a piece of ethicon surgical snow in the GB fossa. The pursestring suture was used to close the umbilical fascia.  An additional interrupted 0 vicryl suture was placed in the umbilical fascia using the PMI suture passer with lap guidance.   We again inspected the right upper quadrant for hemostasis.  The umbilical closure was inspected and there was no air leak and nothing trapped within the closure. Pneumoperitoneum was released as we removed the trocars.  4-0 Monocryl was used to close the skin.   Benzoin, steri-strips, and clean dressings were applied. The patient was then extubated and brought to the recovery room in stable condition. Instrument, sponge, and needle counts were correct at closure and at the conclusion of the case.   Findings: Probable chronic Cholecystitis with Cholelithiasis +critical view +snow  Estimated Blood Loss: Minimal         Drains: none         Specimens: Gallbladder           Complications: None; patient tolerated the procedure well.         Disposition: PACU - hemodynamically stable.         Condition: stable  Leighton Ruff. Redmond Pulling, MD, FACS General, Bariatric, & Minimally Invasive Surgery [facility], [place]

## 2019-10-18 NOTE — Interval H&P Note (Signed)
History and Physical Interval Note:  10/18/2019 9:29 AM  Jessica Richmond  has presented today for surgery, with the diagnosis of SYMPTOMATIC CHOLELITHIASIS.  The various methods of treatment have been discussed with the patient and family. After consideration of risks, benefits and other options for treatment, the patient has consented to  Procedure(s): LAPAROSCOPIC CHOLECYSTECTOMY WITH POSSIBE INTRAOPERATIVE CHOLANGIOGRAM (N/A) as a surgical intervention.  The patient's history has been reviewed, patient examined, no change in status, stable for surgery.  I have reviewed the patient's chart and labs.  Questions were answered to the patient's satisfaction.    Leighton Ruff. Redmond Pulling, MD, FACS General, Bariatric, & Minimally Invasive Surgery [facility] [place]  Greer Pickerel

## 2019-10-18 NOTE — Anesthesia Procedure Notes (Signed)
Procedure Name: Intubation Date/Time: 10/18/2019 10:00 AM Performed by: Raenette Rover, CRNA Pre-anesthesia Checklist: Patient identified, Emergency Drugs available, Suction available and Patient being monitored Patient Re-evaluated:Patient Re-evaluated prior to induction Oxygen Delivery Method: Circle system utilized Preoxygenation: Pre-oxygenation with 100% oxygen Induction Type: IV induction Ventilation: Mask ventilation without difficulty Laryngoscope Size: Mac and 3 Grade View: Grade I Tube type: Oral Tube size: 7.0 mm Number of attempts: 1 Airway Equipment and Method: Stylet Placement Confirmation: ETT inserted through vocal cords under direct vision,  positive ETCO2 and breath sounds checked- equal and bilateral Secured at: 21 cm Tube secured with: Tape Dental Injury: Teeth and Oropharynx as per pre-operative assessment

## 2019-10-18 NOTE — Transfer of Care (Signed)
Immediate Anesthesia Transfer of Care Note  Patient: Jessica Richmond  Procedure(s) Performed: LAPAROSCOPIC CHOLECYSTECTOMY (N/A Abdomen)  Patient Location: PACU  Anesthesia Type:General  Level of Consciousness: drowsy and patient cooperative  Airway & Oxygen Therapy: Patient Spontanous Breathing and Patient connected to face mask oxygen  Post-op Assessment: Report given to RN and Post -op Vital signs reviewed and stable  Post vital signs: Reviewed and stable  Last Vitals:  Vitals Value Taken Time  BP 138/91 10/18/19 1130  Temp    Pulse 74 10/18/19 1131  Resp 17 10/18/19 1131  SpO2 100 % 10/18/19 1131  Vitals shown include unvalidated device data.  Last Pain:  Vitals:   10/18/19 0835  TempSrc:   PainSc: 0-No pain         Complications: No apparent anesthesia complications

## 2019-10-18 NOTE — Anesthesia Postprocedure Evaluation (Signed)
Anesthesia Post Note  Patient: Jessica Richmond  Procedure(s) Performed: LAPAROSCOPIC CHOLECYSTECTOMY (N/A Abdomen)     Patient location during evaluation: PACU Anesthesia Type: General Level of consciousness: awake and alert Pain management: pain level controlled Vital Signs Assessment: post-procedure vital signs reviewed and stable Respiratory status: spontaneous breathing, nonlabored ventilation, respiratory function stable and patient connected to nasal cannula oxygen Cardiovascular status: blood pressure returned to baseline and stable Postop Assessment: no apparent nausea or vomiting Anesthetic complications: no    Last Vitals:  Vitals:   10/18/19 1200 10/18/19 1205  BP: (!) 144/94   Pulse: 75   Resp: 11 11  Temp:    SpO2: 100% 98%    Last Pain:  Vitals:   10/18/19 1145  TempSrc:   PainSc: 0-No pain                 Demorio Seeley DAVID

## 2019-10-18 NOTE — Anesthesia Preprocedure Evaluation (Signed)
Anesthesia Evaluation  Patient identified by MRN, date of birth, ID band Patient awake    Reviewed: Allergy & Precautions, NPO status , Patient's Chart, lab work & pertinent test results  Airway Mallampati: I       Dental no notable dental hx. (+) Teeth Intact   Pulmonary    Pulmonary exam normal breath sounds clear to auscultation       Cardiovascular Normal cardiovascular exam Rhythm:Regular Rate:Normal     Neuro/Psych negative neurological ROS  negative psych ROS   GI/Hepatic negative GI ROS, Neg liver ROS,   Endo/Other  negative endocrine ROS  Renal/GU negative Renal ROS  negative genitourinary   Musculoskeletal negative musculoskeletal ROS (+)   Abdominal (+) + obese,   Peds  Hematology negative hematology ROS (+)   Anesthesia Other Findings   Reproductive/Obstetrics                             Anesthesia Physical Anesthesia Plan  ASA: II  Anesthesia Plan: General   Post-op Pain Management:    Induction: Intravenous  PONV Risk Score and Plan: 4 or greater and Ondansetron, Dexamethasone and Midazolam  Airway Management Planned: Oral ETT  Additional Equipment: None  Intra-op Plan:   Post-operative Plan: Extubation in OR  Informed Consent: I have reviewed the patients History and Physical, chart, labs and discussed the procedure including the risks, benefits and alternatives for the proposed anesthesia with the patient or authorized representative who has indicated his/her understanding and acceptance.     Dental advisory given  Plan Discussed with: CRNA  Anesthesia Plan Comments:         Anesthesia Quick Evaluation

## 2019-10-18 NOTE — Progress Notes (Signed)
Pt discharged home in NAD, VSS, pain tolerable and nausea controlled. Pt given discharge instructions. All questions answered. Pt discharged with friend home.

## 2019-10-18 NOTE — Discharge Instructions (Signed)
Jessica Richmond, P.A. LAPAROSCOPIC SURGERY: POST OP INSTRUCTIONS Always review your discharge instruction sheet given to you by the facility where your surgery was performed. IF YOU HAVE DISABILITY OR FAMILY LEAVE FORMS, YOU MUST BRING THEM TO THE OFFICE FOR PROCESSING.   DO NOT GIVE THEM TO YOUR DOCTOR.  PAIN CONTROL  1. First take acetaminophen (Tylenol) AND/or ibuprofen (Advil) to control your pain after surgery.  Follow directions on package.  Taking acetaminophen (Tylenol) and/or ibuprofen (Advil) regularly after surgery will help to control your pain and lower the amount of prescription pain medication you may need.  You should not take more than 3,000 mg (3 grams) of acetaminophen (Tylenol) in 24 hours.  You should not take ibuprofen (Advil), aleve, motrin, naprosyn or other NSAIDS if you have a history of stomach ulcers or chronic kidney disease.  2. A prescription for pain medication may be given to you upon discharge.  Take your pain medication as prescribed, if you still have uncontrolled pain after taking acetaminophen (Tylenol) or ibuprofen (Advil). 3. Use ice packs to help control pain. 4. If you need a refill on your pain medication, please contact your pharmacy.  They will contact our office to request authorization. Prescriptions will not be filled after 5pm or on week-ends.  HOME MEDICATIONS 5. Take your usually prescribed medications unless otherwise directed.  DIET 6. You should follow a light diet the first few days after arrival home.  Be sure to include lots of fluids daily. Avoid fatty, fried foods.   CONSTIPATION 7. It is common to experience some constipation after surgery and if you are taking pain medication.  Increasing fluid intake and taking a stool softener (such as Colace) will usually help or prevent this problem from occurring.  A mild laxative (Milk of Magnesia or Miralax) should be taken according to package instructions if there are no bowel  movements after 48 hours.  WOUND/INCISION CARE 8. Most patients will experience some swelling and bruising in the area of the incisions.  Ice packs will help.  Swelling and bruising can take several days to resolve.  9. Unless discharge instructions indicate otherwise, follow guidelines below  a. STERI-STRIPS - you may remove your outer bandages 48 hours after surgery, and you may shower at that time.  You have steri-strips (small skin tapes) in place directly over the incision.  These strips should be left on the skin for 7-10 days.   b. DERMABOND/SKIN GLUE - you may shower in 24 hours.  The glue will flake off over the next 2-3 weeks. 10. Any sutures or staples will be removed at the office during your follow-up visit.  ACTIVITIES 11. You may resume regular (light) daily activities beginning the next day--such as daily self-care, walking, climbing stairs--gradually increasing activities as tolerated.  You may have sexual intercourse when it is comfortable.  Refrain from any heavy lifting or straining until approved by your doctor. a. You may drive when you are no longer taking prescription pain medication, you can comfortably wear a seatbelt, and you can safely maneuver your car and apply brakes.  FOLLOW-UP 12. You should see your doctor in the office for a follow-up appointment approximately 2-3 weeks after your surgery.  You should have been given your post-op/follow-up appointment when your surgery was scheduled.  If you did not receive a post-op/follow-up appointment, make sure that you call for this appointment within a day or two after you arrive home to insure a convenient appointment time.  OTHER  INSTRUCTIONS 13.   WHEN TO CALL YOUR DOCTOR: 1. Fever over 101.0 2. Inability to urinate 3. Continued bleeding from incision. 4. Increased pain, redness, or drainage from the incision. 5. Increasing abdominal pain  The clinic staff is available to answer your questions during regular  business hours.  Please don't hesitate to call and ask to speak to one of the nurses for clinical concerns.  If you have a medical emergency, go to the nearest emergency room or call 911.  A surgeon from [facility] is always on call at the hospital. [address], [place], [place], [place]  [address] ? [address], [place], [place]   [address] [phone] ? [phone] ? FAX [phone] Web site: [email or web address]  ........Marland Kitchen   Managing Your Pain After Surgery Without Opioids    Thank you for participating in our program to help patients manage their pain after surgery without opioids. This is part of our effort to provide you with the best care possible, without exposing you or your family to the risk that opioids pose.  What pain can I expect after surgery? You can expect to have some pain after surgery. This is normal. The pain is typically worse the day after surgery, and quickly begins to get better. Many studies have found that many patients are able to manage their pain after surgery with Over-the-Counter (OTC) medications such as Tylenol and Motrin. If you have a condition that does not allow you to take Tylenol or Motrin, notify your surgical team.  How will I manage my pain? The best strategy for controlling your pain after surgery is around the clock pain control with Tylenol (acetaminophen) and Motrin (ibuprofen or Advil). Alternating these medications with each other allows you to maximize your pain control. In addition to Tylenol and Motrin, you can use heating pads or ice packs on your incisions to help reduce your pain.  How will I alternate your regular strength over-the-counter pain medication? You will take a dose of pain medication every three hours. ; Start by taking 650 mg of Tylenol (2 pills of 325 mg) ; 3 hours later take 600 mg of Motrin (3 pills of 200 mg) ; 3 hours after taking the Motrin take 650 mg of Tylenol ; 3 hours  after that take 600 mg of Motrin.   - 1 -  See example - if your first dose of Tylenol is at 12:00 PM   12:00 PM Tylenol 650 mg (2 pills of 325 mg)  3:00 PM Motrin 600 mg (3 pills of 200 mg)  6:00 PM Tylenol 650 mg (2 pills of 325 mg)  9:00 PM Motrin 600 mg (3 pills of 200 mg)  Continue alternating every 3 hours   We recommend that you follow this schedule around-the-clock for at least 3 days after surgery, or until you feel that it is no longer needed. Use the table on the last page of this handout to keep track of the medications you are taking. Important: Do not take more than 3000mg  of Tylenol or 1800mg  of Motrin in a 24-hour period. Do not take ibuprofen/Motrin if you have a history of bleeding stomach ulcers, severe kidney disease, &/or actively taking a blood thinner  What if I still have pain? If you have pain that is not controlled with the over-the-counter pain medications (Tylenol and Motrin or Advil) you might have what we call "breakthrough" pain. You will receive a prescription for a small amount of an opioid  pain medication such as Oxycodone, Tramadol, or Tylenol with Codeine. Use these opioid pills in the first 24 hours after surgery if you have breakthrough pain. Do not take more than 1 pill every 4-6 hours.  If you still have uncontrolled pain after using all opioid pills, don't hesitate to call our staff using the number provided. We will help make sure you are managing your pain in the best way possible, and if necessary, we can provide a prescription for additional pain medication.   Day 1    Time  Name of Medication Number of pills taken  Amount of Acetaminophen  Pain Level   Comments  AM PM       AM PM       AM PM       AM PM       AM PM       AM PM       AM PM       AM PM       Total Daily amount of Acetaminophen Do not take more than  3,000 mg per day      Day 2    Time  Name of Medication Number of pills taken  Amount of Acetaminophen  Pain  Level   Comments  AM PM       AM PM       AM PM       AM PM       AM PM       AM PM       AM PM       AM PM       Total Daily amount of Acetaminophen Do not take more than  3,000 mg per day      Day 3    Time  Name of Medication Number of pills taken  Amount of Acetaminophen  Pain Level   Comments  AM PM       AM PM       AM PM       AM PM          AM PM       AM PM       AM PM       AM PM       Total Daily amount of Acetaminophen Do not take more than  3,000 mg per day      Day 4    Time  Name of Medication Number of pills taken  Amount of Acetaminophen  Pain Level   Comments  AM PM       AM PM       AM PM       AM PM       AM PM       AM PM       AM PM       AM PM       Total Daily amount of Acetaminophen Do not take more than  3,000 mg per day      Day 5    Time  Name of Medication Number of pills taken  Amount of Acetaminophen  Pain Level   Comments  AM PM       AM PM       AM PM       AM PM       AM PM       AM PM  AM PM       AM PM       Total Daily amount of Acetaminophen Do not take more than  3,000 mg per day       Day 6    Time  Name of Medication Number of pills taken  Amount of Acetaminophen  Pain Level  Comments  AM PM       AM PM       AM PM       AM PM       AM PM       AM PM       AM PM       AM PM       Total Daily amount of Acetaminophen Do not take more than  3,000 mg per day      Day 7    Time  Name of Medication Number of pills taken  Amount of Acetaminophen  Pain Level   Comments  AM PM       AM PM       AM PM       AM PM       AM PM       AM PM       AM PM       AM PM       Total Daily amount of Acetaminophen Do not take more than  3,000 mg per day        For additional information about how and where to safely dispose of unused opioid medications - https://www.morepowerfulnc.org  Disclaimer: This document contains information and/or instructional materials adapted  from [facility] for the typical patient with your condition. It does not replace medical advice from your health care provider because your experience may differ from that of the typical patient. Talk to your health care provider if you have any questions about this document, your condition or your treatment plan. Adapted from [facility]   

## 2019-10-19 ENCOUNTER — Encounter: Payer: Self-pay | Admitting: *Deleted

## 2019-10-19 LAB — SURGICAL PATHOLOGY

## 2019-10-19 NOTE — Anesthesia Postprocedure Evaluation (Signed)
Anesthesia Post Note  Patient: Jessica Richmond  Procedure(s) Performed: LAPAROSCOPIC CHOLECYSTECTOMY (N/A Abdomen)     Patient location during evaluation: PACU Anesthesia Type: General Level of consciousness: awake Pain management: pain level controlled Vital Signs Assessment: post-procedure vital signs reviewed and stable Respiratory status: spontaneous breathing Cardiovascular status: stable Postop Assessment: no headache Anesthetic complications: yes Anesthetic complication details: PONV   Last Vitals:  Vitals:   10/18/19 1300 10/18/19 1400  BP: 121/78 118/75  Pulse: 78 80  Resp:    Temp:    SpO2: 96% 96%    Last Pain:  Vitals:   10/18/19 1245  TempSrc:   PainSc: 4    Pain Goal:                   Huston Foley

## 2019-10-26 ENCOUNTER — Encounter: Payer: Self-pay | Admitting: Osteopathic Medicine

## 2020-02-23 ENCOUNTER — Encounter: Payer: Self-pay | Admitting: Osteopathic Medicine

## 2020-02-23 DIAGNOSIS — R0683 Snoring: Secondary | ICD-10-CM

## 2020-02-23 DIAGNOSIS — Z9189 Other specified personal risk factors, not elsewhere classified: Secondary | ICD-10-CM

## 2020-05-15 ENCOUNTER — Ambulatory Visit (HOSPITAL_BASED_OUTPATIENT_CLINIC_OR_DEPARTMENT_OTHER): Payer: BC Managed Care – PPO | Attending: Osteopathic Medicine | Admitting: Internal Medicine

## 2020-05-15 ENCOUNTER — Other Ambulatory Visit: Payer: Self-pay

## 2020-05-15 VITALS — Ht 61.0 in | Wt 171.0 lb

## 2020-05-15 DIAGNOSIS — Z9189 Other specified personal risk factors, not elsewhere classified: Secondary | ICD-10-CM

## 2020-05-15 DIAGNOSIS — G4733 Obstructive sleep apnea (adult) (pediatric): Secondary | ICD-10-CM | POA: Diagnosis not present

## 2020-05-15 DIAGNOSIS — R0683 Snoring: Secondary | ICD-10-CM

## 2020-05-20 DIAGNOSIS — Z9189 Other specified personal risk factors, not elsewhere classified: Secondary | ICD-10-CM

## 2020-05-20 DIAGNOSIS — R0683 Snoring: Secondary | ICD-10-CM

## 2020-05-20 NOTE — Procedures (Signed)
    Patient Name: Jessica Richmond, Jessica Richmond Date: 05/15/2020 Gender: Female D.O.B: [birth year] Age (years): 44 Referring Provider: Emeterio Reeve Height (inches): 61 Interpreting Physician: Baird Lyons MD, ABSM Weight (lbs): 171 RPSGT: Jorge Ny BMI: 32 MRN: [id number] Neck Size: 14.00  CLINICAL INFORMATION Sleep Study Type: HST Indication for sleep study: Snoring Epworth Sleepiness Score: 13  SLEEP STUDY TECHNIQUE A multi-channel overnight portable sleep study was performed. The channels recorded were: nasal airflow, thoracic respiratory movement, and oxygen saturation with a pulse oximetry. Snoring was also monitored.  MEDICATIONS Patient self administered medications include: none reported.  SLEEP ARCHITECTURE Patient was studied for 565.9 minutes. The sleep efficiency was 100.0 % and the patient was supine for 95.9%. The arousal index was 0.0 per hour.  RESPIRATORY PARAMETERS The overall AHI was 8.0 per hour, with a central apnea index of 0.0 per hour. The oxygen nadir was 81% during sleep.  CARDIAC DATA Mean heart rate during sleep was 72.5 bpm.  IMPRESSIONS - Mild obstructive sleep apnea occurred during this study (AHI = 8.0/h). - No significant central sleep apnea occurred during this study (CAI = 0.0/h). - Oxygen desaturation was noted during this study (Min O2 = 81%). Mean O2 sat 95%. - Patient snored.  DIAGNOSIS - Obstructive Sleep Apnea (G47.33)  RECOMMENDATIONS - Treatment for mild OSA is directed at symptoms. Conservative measures may include observation, weight loss and sleep postion off back. Otherr options, including CPAP, a fitted oral appliance, or referral to sleep specialist or ENT, would be based on clinical judgment. - Be careful with alcohol, sedatives and other CNS depressants that may worsen sleep apnea and disrupt normal sleep architecture. - Sleep hygiene should be reviewed to assess factors that may improve sleep quality. - Weight  management and regular exercise should be initiated or continued.  [Electronically signed] 05/20/2020 11:45 AM  Baird Lyons MD, ABSM Diplomate, American Board of Sleep Medicine   NPI: [id number]                        [place]  ELECTRONICALLY SIGNED ON:  05/20/2020, 11:41 AM [place] PH: [phone]   FX: [phone] [place]

## 2020-05-23 ENCOUNTER — Encounter: Payer: Self-pay | Admitting: Osteopathic Medicine

## 2020-05-23 DIAGNOSIS — G4733 Obstructive sleep apnea (adult) (pediatric): Secondary | ICD-10-CM

## 2020-05-30 ENCOUNTER — Other Ambulatory Visit: Payer: Self-pay | Admitting: Osteopathic Medicine

## 2020-05-30 DIAGNOSIS — G4733 Obstructive sleep apnea (adult) (pediatric): Secondary | ICD-10-CM | POA: Insufficient documentation

## 2020-05-30 NOTE — Progress Notes (Signed)
Referral for sleep / OSA

## 2020-06-06 ENCOUNTER — Telehealth: Payer: Self-pay

## 2020-06-06 ENCOUNTER — Other Ambulatory Visit: Payer: Self-pay | Admitting: Obstetrics & Gynecology

## 2020-06-06 DIAGNOSIS — Z1231 Encounter for screening mammogram for malignant neoplasm of breast: Secondary | ICD-10-CM

## 2020-06-06 DIAGNOSIS — D1803 Hemangioma of intra-abdominal structures: Secondary | ICD-10-CM

## 2020-06-06 NOTE — Telephone Encounter (Signed)
Per result notes from MRI in February "MyChart message sent: MRI showed what is likely a benign hemangioma, which is a collection of blood vessels that we commonly see in the liver. This is not anything cancerous, but as a precaution we will usually schedule follow-up imaging in 6 to 12 months to make sure that it is not changing. There was very mild fatty liver and a gallstone. I have set a reminder in the chart for 6 months to schedule follow-up MRI! "  Time for repeat MRI. New order is needed. I have pended order, updated diagnosis to hemangioma. Please review and sign if OK

## 2020-06-07 NOTE — Telephone Encounter (Signed)
Order is signed, thanks!

## 2020-06-26 ENCOUNTER — Other Ambulatory Visit: Payer: Self-pay

## 2020-06-26 ENCOUNTER — Ambulatory Visit (INDEPENDENT_AMBULATORY_CARE_PROVIDER_SITE_OTHER): Payer: BC Managed Care – PPO

## 2020-06-26 DIAGNOSIS — Z9049 Acquired absence of other specified parts of digestive tract: Secondary | ICD-10-CM | POA: Diagnosis not present

## 2020-06-26 DIAGNOSIS — K7689 Other specified diseases of liver: Secondary | ICD-10-CM | POA: Diagnosis not present

## 2020-06-26 DIAGNOSIS — D1803 Hemangioma of intra-abdominal structures: Secondary | ICD-10-CM | POA: Diagnosis not present

## 2020-06-26 DIAGNOSIS — N281 Cyst of kidney, acquired: Secondary | ICD-10-CM | POA: Diagnosis not present

## 2020-06-26 MED ORDER — GADOBUTROL 1 MMOL/ML IV SOLN
8.0000 mL | Freq: Once | INTRAVENOUS | Status: AC | PRN
  Administered 2020-06-26: 10 mL via INTRAVENOUS

## 2020-06-26 NOTE — Telephone Encounter (Signed)
Liver lesion stable - recommend follow-up MRI in 6 mos, reminder set to contact you at that time to set up MRI

## 2020-06-29 ENCOUNTER — Ambulatory Visit: Payer: BC Managed Care – PPO

## 2020-07-05 ENCOUNTER — Other Ambulatory Visit: Payer: Self-pay

## 2020-07-05 ENCOUNTER — Ambulatory Visit (INDEPENDENT_AMBULATORY_CARE_PROVIDER_SITE_OTHER): Payer: BC Managed Care – PPO

## 2020-07-05 DIAGNOSIS — Z1231 Encounter for screening mammogram for malignant neoplasm of breast: Secondary | ICD-10-CM

## 2020-08-17 IMAGING — MR MR ABDOMEN WO/W CM
16 of 17 series · 44 of 48 positions shown · IV contrast (8 ML GADAVIST)
Comparison: None.

CLINICAL DATA: Elevated liver enzymes. Steatohepatitis. Focal liver
lesion noted on recent ultrasound.

EXAM:
MRI ABDOMEN WITHOUT AND WITH CONTRAST
TECHNIQUE: Multiplanar multisequence MR imaging of the abdomen was performed
both before and after the administration of intravenous contrast.
CONTRAST:  8mL GADAVIST GADOBUTROL 1 MMOL/ML IV SOLN

[Series 2: cor ssfse / · coronal · 7.0mm · 1.48mm/px · 2 of 30 slices shown]
[im 1/30]
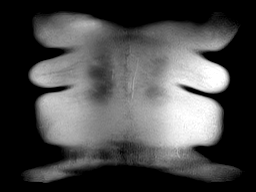
[im 30/30]
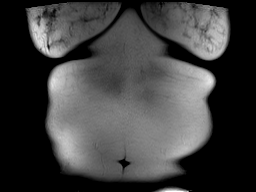

[Series 3: T2 fat-sat · axial · 6.0mm · 1.48mm/px · z∈[-101,+108]mm · 2 of 30 slices shown]
[im 1/30]
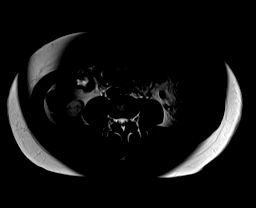
[im 30/30]
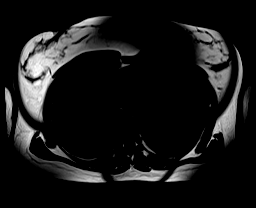

[Series 4: T1 · axial · 6.0mm · 0.74mm/px · z∈[-101,+108]mm · 4 of 60 slices shown]
[im 1/60]
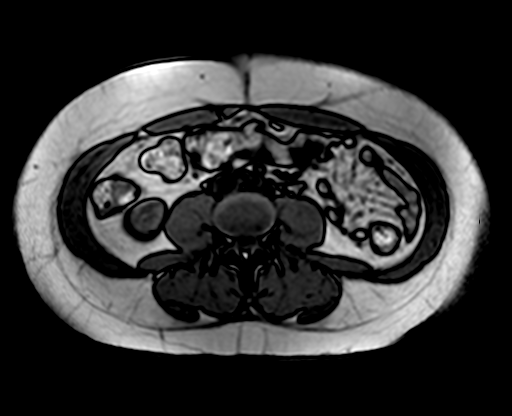
[im 20/60]
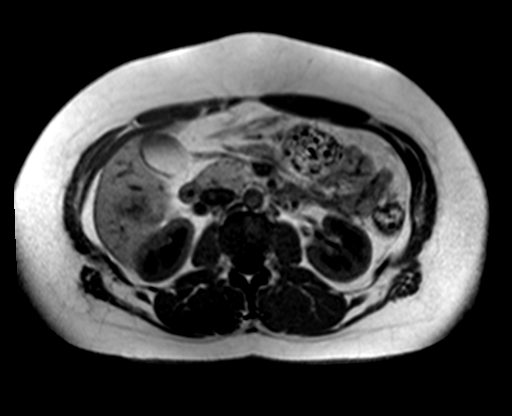
[im 40/60]
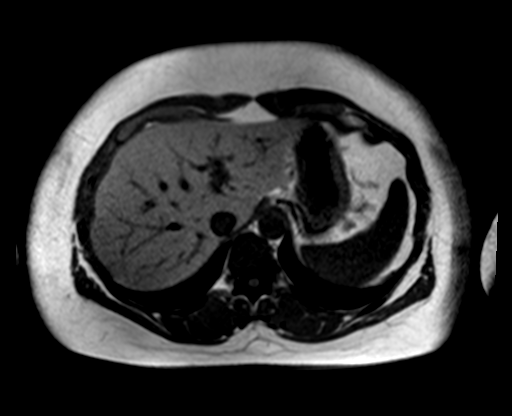
[im 60/60]
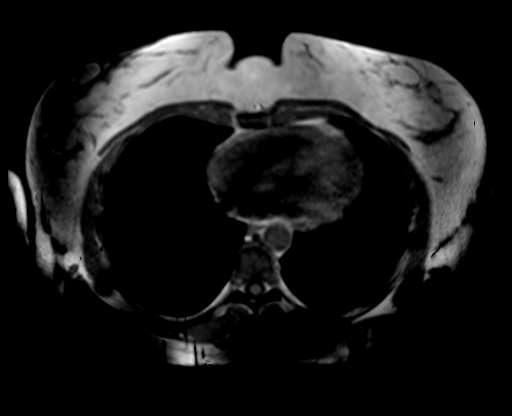

[Series 7: DWI · axial · 6.0mm · 2.00mm/px · z∈[-101,+108]mm · 5 of 90 slices shown (1 of 2)]
[im 1/90]
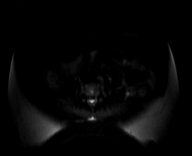
[im 23/90]
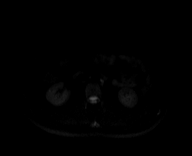
[im 45/90]
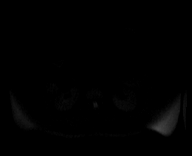
[im 67/90]
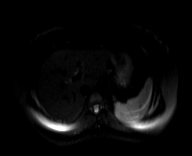
[im 90/90]
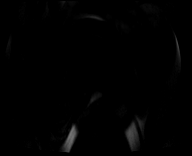

[Series 8: DWI · axial · 6.0mm · 2.00mm/px · z∈[-101,+108]mm · 2 of 30 slices shown (2 of 2)]
[im 1/30]
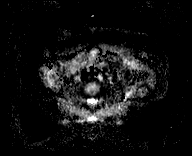
[im 30/30]
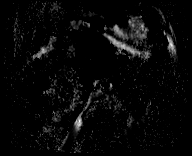

[Series 9: bSSFP · axial · 6.0mm · 0.74mm/px · 1 of 30 slices shown]
[im 1/30]
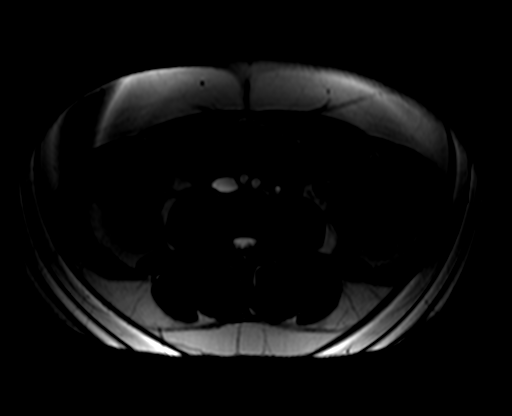

[Series 10: axial dynamic pre · axial · non-contrast · 3.0mm · 1.19mm/px · z∈[-103,+110]mm · 3 of 72 slices shown]
[im 1/72]
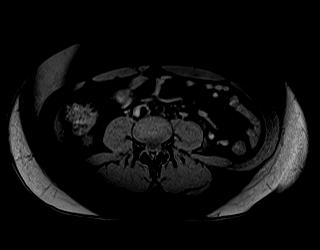
[im 36/72]
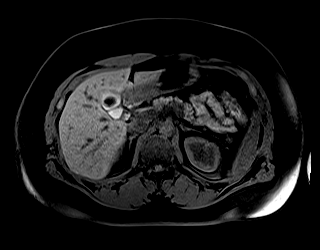
[im 72/72]
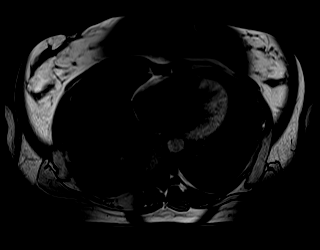

[Series 11: axial dynamic post · axial · 3.0mm · 1.19mm/px · z∈[-103,+110]mm · 3 of 72 slices shown (1 of 3)]
[im 1/72]
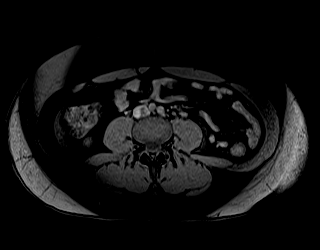
[im 36/72]
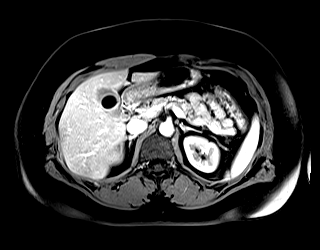
[im 72/72]
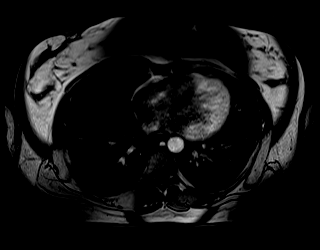

[Series 12: axial dynamic post · axial · 3.0mm · 1.19mm/px · z∈[-103,+110]mm · 3 of 72 slices shown (2 of 3)]
[im 1/72]
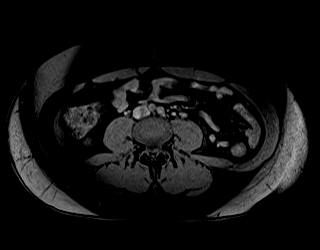
[im 36/72]
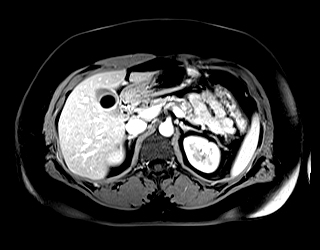
[im 72/72]
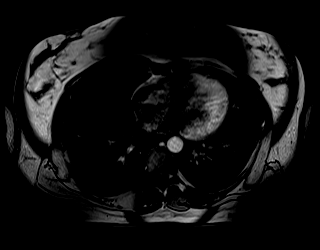

[Series 13: axial dynamic post · axial · 3.0mm · 1.19mm/px · z∈[-103,+110]mm · 3 of 72 slices shown (3 of 3)]
[im 1/72]
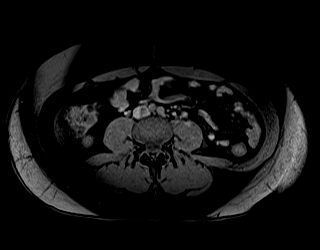
[im 36/72]
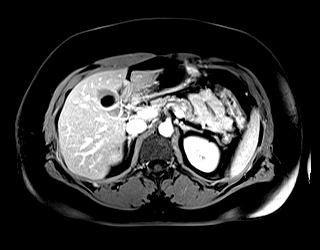
[im 72/72]
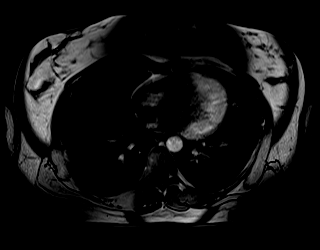

[Series 15: axial dynamic delayed · axial · 3.0mm · 1.19mm/px · z∈[-103,+110]mm · 3 of 72 slices shown]
[im 1/72]
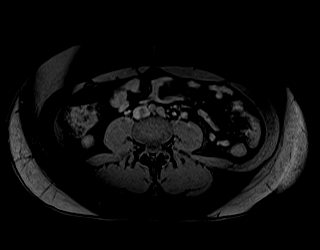
[im 36/72]
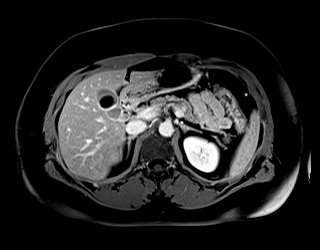
[im 72/72]
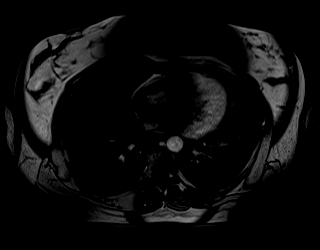

[Series 16: axial ssfse / · axial · 6.0mm · 1.19mm/px · 1 of 30 slices shown]
[im 1/30]
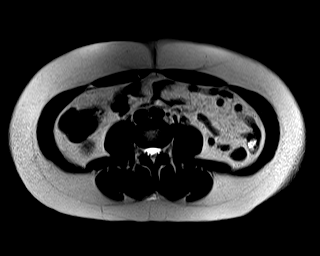

[Series 100: sub_ax dynamic post · axial · 3.0mm · 1.19mm/px · z∈[-103,+110]mm · 3 of 72 slices shown (1 of 4)]
[im 1/72]
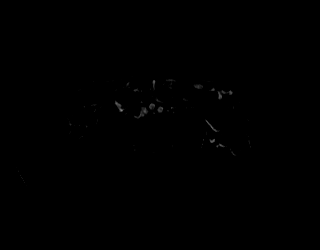
[im 36/72]
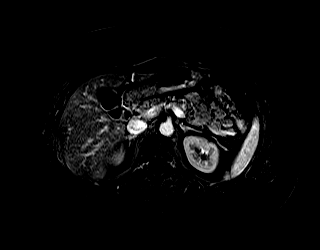
[im 72/72]
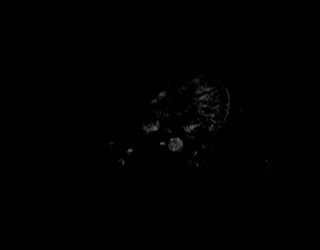

[Series 101: sub_ax dynamic post · axial · 3.0mm · 1.19mm/px · z∈[-103,+110]mm · 3 of 72 slices shown (2 of 4)]
[im 1/72]
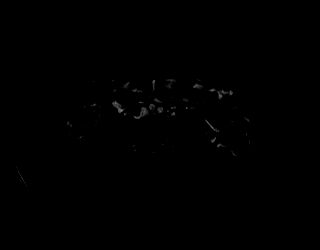
[im 36/72]
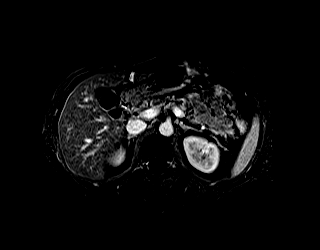
[im 72/72]
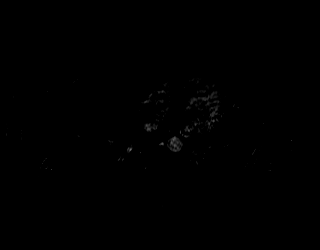

[Series 102: sub_ax dynamic post · axial · 3.0mm · 1.19mm/px · z∈[-103,+110]mm · 3 of 72 slices shown (3 of 4)]
[im 1/72]
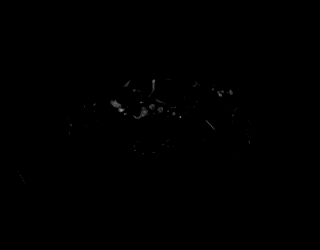
[im 36/72]
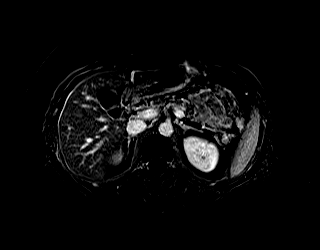
[im 72/72]
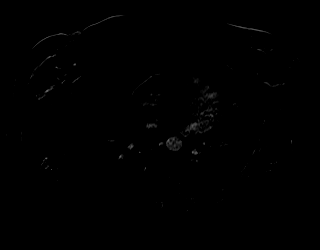

[Series 103: sub_ax dynamic post · axial · 3.0mm · 1.19mm/px · z∈[-103,+110]mm · 3 of 72 slices shown (4 of 4)]
[im 1/72]
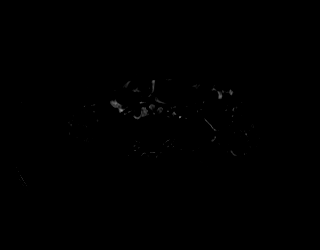
[im 36/72]
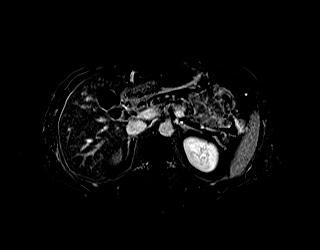
[im 72/72]
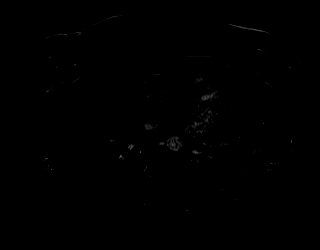

[44 of 48 positions shown; findings below may reference images not displayed]

FINDINGS: Lower chest: No acute findings.

Hepatobiliary: Mild hepatic steatosis. Within the inferior right
hepatic lobe there is a ill-defined area of mild increase T2 signal
and decreased T1 signal corresponding to the ultrasound abnormality.
On the early postcontrast images there is heterogeneous and
discontinuous enhancement associated with this structure. Persistent
and progressive internal enhancement of this lesion is identified on
the delayed images. This has a maximum dimension of 3.3 cm and is
favored to represent atypical hemangioma.

Gallstone measures 2 cm. No gallbladder wall inflammation. No
biliary ductal dilatation.

Pancreas: No mass, inflammatory changes, or other parenchymal
abnormality identified.

Spleen:  Within normal limits in size and appearance.

Adrenals/Urinary Tract: Normal appearance of the adrenal glands. The
left kidney is unremarkable. Tiny cyst is noted within inferior pole
of the right kidney measuring 4 mm.

Stomach/Bowel: Visualized portions within the abdomen are
unremarkable.

Vascular/Lymphatic: No pathologically enlarged lymph nodes
identified. No abdominal aortic aneurysm demonstrated.

Other:  No free fluid or fluid collections.

Musculoskeletal: No suspicious bone lesions identified.
IMPRESSION: 1. Heterogeneously enhancing lesion within the inferior right
hepatic lobe is favored to represent benign atypical hemangioma.
Suggest follow-up imaging in 6-12 months with repeat MRI to ensure
stability of this likely benign abnormality.
2. Mild hepatic steatosis.
3. Cholelithiasis.

## 2020-08-28 ENCOUNTER — Ambulatory Visit (INDEPENDENT_AMBULATORY_CARE_PROVIDER_SITE_OTHER): Payer: BC Managed Care – PPO | Admitting: Obstetrics & Gynecology

## 2020-08-28 ENCOUNTER — Encounter: Payer: Self-pay | Admitting: Obstetrics & Gynecology

## 2020-08-28 ENCOUNTER — Other Ambulatory Visit: Payer: Self-pay

## 2020-08-28 VITALS — BP 143/95 | HR 79 | Ht 61.0 in | Wt 172.0 lb

## 2020-08-28 DIAGNOSIS — Z01419 Encounter for gynecological examination (general) (routine) without abnormal findings: Secondary | ICD-10-CM

## 2020-08-28 DIAGNOSIS — I10 Essential (primary) hypertension: Secondary | ICD-10-CM

## 2020-08-28 LAB — CBC
HCT: 41.7 % (ref 35.0–45.0)
Hemoglobin: 13.9 g/dL (ref 11.7–15.5)
MCH: 30 pg (ref 27.0–33.0)
MCHC: 33.3 g/dL (ref 32.0–36.0)
MCV: 89.9 fL (ref 80.0–100.0)
MPV: 10.4 fL (ref 7.5–12.5)
Platelets: 352 10*3/uL (ref 140–400)
RBC: 4.64 10*6/uL (ref 3.80–5.10)
RDW: 11.8 % (ref 11.0–15.0)
WBC: 5.7 10*3/uL (ref 3.8–10.8)

## 2020-08-28 LAB — LIPID PANEL
Cholesterol: 222 mg/dL — ABNORMAL HIGH (ref <200)
HDL: 53 mg/dL (ref >=50)
LDL Cholesterol (Calc): 143 mg/dL (calc) — ABNORMAL HIGH
Non-HDL Cholesterol (Calc): 169 mg/dL (calc) — ABNORMAL HIGH (ref <130)
Total CHOL/HDL Ratio: 4.2 (calc) (ref <5.0)
Triglycerides: 134 mg/dL (ref <150)

## 2020-08-28 LAB — COMPREHENSIVE METABOLIC PANEL
AG Ratio: 1.7 (calc) (ref 1.0–2.5)
ALT: 19 U/L (ref 6–29)
AST: 19 U/L (ref 10–35)
Albumin: 4.3 g/dL (ref 3.6–5.1)
Alkaline phosphatase (APISO): 87 U/L (ref 31–125)
BUN: 14 mg/dL (ref 7–25)
CO2: 26 mmol/L (ref 20–32)
Calcium: 9.6 mg/dL (ref 8.6–10.2)
Chloride: 105 mmol/L (ref 98–110)
Creat: 1.06 mg/dL (ref 0.50–1.10)
Globulin: 2.5 g/dL (calc) (ref 1.9–3.7)
Glucose, Bld: 97 mg/dL (ref 65–99)
Potassium: 4.3 mmol/L (ref 3.5–5.3)
Sodium: 138 mmol/L (ref 135–146)
Total Bilirubin: 0.8 mg/dL (ref 0.2–1.2)
Total Protein: 6.8 g/dL (ref 6.1–8.1)

## 2020-08-28 NOTE — Progress Notes (Signed)
Last mam- 07/05/20- normal Last pap- 05/24/19- negative

## 2020-08-28 NOTE — Progress Notes (Signed)
Subjective:     Jessica Richmond is a 45 y.o. female here for a routine exam.  Current complaints: none.  Doing well with Mirena. Placed in 2017.  Pt aware of 7 years indication with Mirena.   Gynecologic History No LMP recorded. (Menstrual status: IUD). Contraception: IUD Last Pap: 2020. Results were: normal Last mammogram: 2021. Results were: normal  Obstetric History OB History  Gravida Para Term Preterm AB Living  0 0 0 0 0 0  SAB IAB Ectopic Multiple Live Births  0 0 0 0       The following portions of the patient's history were reviewed and updated as appropriate: allergies, current medications, past family history, past medical history, past social history, past surgical history and problem list.  Review of Systems Pertinent items noted in HPI and remainder of comprehensive ROS otherwise negative.    Objective:      Vitals:   08/28/20 0816  BP: (!) 143/95  Pulse: 79  Weight: 172 lb (78 kg)  Height: 5\' 1"  (1.549 m)   Vitals:  WNL General appearance: alert, cooperative and no distress  HEENT: Normocephalic, without obvious abnormality, atraumatic Eyes: negative Throat: lips, mucosa, and tongue normal; teeth and gums normal  Respiratory: Clear to auscultation bilaterally  CV: Regular rate and rhythm  Breasts:  Normal appearance, no masses or tenderness, no nipple retraction or dimpling  GI: Soft, non-tender; bowel sounds normal; no masses,  no organomegaly  GU: External Genitalia:  Tanner V, no lesion Urethra:  No prolapse   Vagina: Pink, normal rugae, no blood or discharge  Cervix: No CMT, no lesion  Uterus:  Normal size and contour, non tender  Adnexa: Normal, no masses, non tender  Musculoskeletal: No edema, redness or tenderness in the calves or thighs  Skin: No lesions or rash  Lymphatic: Axillary adenopathy: none     Psychiatric: Normal mood and behavior   Home 120s/70s at home with home BP cuff.     Assessment:    Healthy female exam.    Plan:    1.  Pap not due until 2023 earliest 2.  Yearly mammograms 3.  Yearly labs (pt fasting) 4.  Pt to bring BP machine to next MD visit and compare readings.  All are normal at home.  5.  Reviewed new colon cancer screening recommendations. 6.  Family hx negative for familial cancers 7.  Covid and Flu up to date.   Follow up in: 1 year.

## 2020-10-23 ENCOUNTER — Encounter: Payer: Self-pay | Admitting: Osteopathic Medicine

## 2020-10-23 ENCOUNTER — Other Ambulatory Visit: Payer: Self-pay

## 2020-10-23 ENCOUNTER — Ambulatory Visit (INDEPENDENT_AMBULATORY_CARE_PROVIDER_SITE_OTHER): Payer: BC Managed Care – PPO | Admitting: Osteopathic Medicine

## 2020-10-23 VITALS — BP 137/87 | HR 78 | Temp 98.2°F | Wt 172.1 lb

## 2020-10-23 DIAGNOSIS — D1803 Hemangioma of intra-abdominal structures: Secondary | ICD-10-CM

## 2020-10-23 DIAGNOSIS — E782 Mixed hyperlipidemia: Secondary | ICD-10-CM

## 2020-10-23 DIAGNOSIS — R030 Elevated blood-pressure reading, without diagnosis of hypertension: Secondary | ICD-10-CM | POA: Diagnosis not present

## 2020-10-23 DIAGNOSIS — G4733 Obstructive sleep apnea (adult) (pediatric): Secondary | ICD-10-CM

## 2020-10-23 DIAGNOSIS — Z1211 Encounter for screening for malignant neoplasm of colon: Secondary | ICD-10-CM

## 2020-10-23 NOTE — Progress Notes (Signed)
Jessica Richmond is a 45 y.o. female who presents to  [place] at [facility]  today, 10/23/20, seeking care for the following: discuss labs (ordered by OBGYN), BP follow up other chronic issues   . Elevated BP Hx OSA, white coat syndrome  BP Readings from Last 3 Encounters:  10/23/20 137/87  08/28/20 (!) 143/95  10/18/19 118/75    Elevated cholesterol  Lipid Panel     Component Value Date/Time   CHOL 222 (H) 08/28/2020 0000   TRIG 134 08/28/2020 0000   HDL 53 08/28/2020 0000   CHOLHDL 4.2 08/28/2020 0000   VLDL 19.6 09/18/2016 0847   LDLCALC 143 (H) 08/28/2020 0000  The 10-year ASCVD risk score Mikey Bussing DC Jr., et al., 2013) is: 1.7%   Values used to calculate the score:     Age: 57 years     Sex: Female     Is Non-Hispanic African American: No     Diabetic: No     Tobacco smoker: No     Systolic Blood Pressure: 097 mmHg     Is BP treated: Yes     HDL Cholesterol: 53 mg/dL     Total Cholesterol: 222 mg/dL   OSA Needs f/u w/ pulmonary    Liver hemangioma  MRI re-ordered 06/2020 recommended f/u again in 6 mos to ensure 12-mo stability, that MRI follow-up has been ordered to be scheduled 12/2020     ASSESSMENT & PLAN with other pertinent findings:  The primary encounter diagnosis was Mixed hyperlipidemia. Diagnoses of White coat syndrome without diagnosis of hypertension, Obstructive sleep apnea syndrome, Hemangioma of liver, and Colon cancer screening were also pertinent to this visit.   HTN: pt home BP numbers at goal or close, will recheck home numbers throughout the day instead of just early AM and get better average, goal <130/80 discussed   Lipids: not concerning, no need to start statin   Orders Placed This Encounter  Procedures  . Ambulatory referral to Gastroenterology    No orders of the defined types were placed in this encounter.    See below for relevant physical exam findings  See below for recent lab  and imaging results reviewed  Medications, allergies, PMH, PSH, SocH, FamH reviewed below    Follow-up instructions: Return in about 1 year (around 10/23/2021) for ANNUAL CHECK-UP - SEE [place] SOONER IF NEEDED.                                        Exam:  BP 137/87 (BP Location: Left Arm, Patient Position: Sitting, Cuff Size: Normal)   Pulse 78   Temp 98.2 F (36.8 C) (Oral)   Wt 172 lb 1.3 oz (78.1 kg)   BMI 32.51 kg/m   Constitutional: VS see above. General Appearance: alert, well-developed, well-nourished, NAD  Neck: No masses, trachea midline.   Respiratory: Normal respiratory effort. no wheeze, no rhonchi, no rales  Cardiovascular: S1/S2 normal, no murmur, no rub/gallop auscultated. RRR.   Musculoskeletal: Gait normal. Symmetric and independent movement of all extremities  Neurological: Normal balance/coordination. No tremor.  Skin: warm, dry, intact.   Psychiatric: Normal judgment/insight. Normal mood and affect. Oriented x3.   Current Meds  Medication Sig  . cetirizine (ZYRTEC) 10 MG tablet Take 10 mg by mouth daily.  Marland Kitchen levonorgestrel (MIRENA) 20 MCG/24HR IUD 1 each by Intrauterine route once.  . Vitamin D,  Cholecalciferol, 25 MCG (1000 UT) TABS Take 1,000 Units by mouth daily.    Allergies  Allergen Reactions  . Keflex [Cephalexin] Hives  . Indomethacin Nausea Only, Other (See Comments) and Nausea And Vomiting    Dizziness  . Milk-Related Compounds   . Shrimp [Shellfish Allergy]   . Wheat Bran     Patient Active Problem List   Diagnosis Date Noted  . Obstructive sleep apnea syndrome 05/30/2020  . Elevated liver enzymes 09/02/2019  . Mixed hyperlipidemia 09/02/2019  . Foot pain, bilateral 09/02/2019  . Chronic RUQ pain 09/02/2019  . Allergy to shrimp 09/18/2016  . Weight gain 09/18/2016  . Gluten intolerance 09/18/2016  . Dizziness 03/12/2016  . White coat syndrome without diagnosis of hypertension 06/13/2015  .  Cystitis 07/07/2014  . B12 deficiency 12/27/2013  . Abdominal bloating 12/06/2013  . Allergic rhinitis 12/06/2013    Family History  Problem Relation Age of Onset  . Heart disease Mother 4       A fib  . Fibrocystic breast disease Mother        partial mastectomy  . Hypertension Father   . Diabetes Father   . Diverticulosis Father   . Depression Father   . Crohn's disease Sister   . Ovarian cysts Sister   . Raynaud syndrome Sister   . Depression Sister   . Uterine cancer Other        maternal great grandmother    Social History   Tobacco Use  Smoking Status Never Smoker  Smokeless Tobacco Never Used    Past Surgical History:  Procedure Laterality Date  . CHOLECYSTECTOMY N/A 10/18/2019   Procedure: LAPAROSCOPIC CHOLECYSTECTOMY;  Surgeon: Greer Pickerel, MD;  Location: [facility];  Service: General;  Laterality: N/A;  . HIP ARTHROSCOPY Left    torn labrumin  . WISDOM TOOTH EXTRACTION      Immunization History  Administered Date(s) Administered  . Influenza,inj,Quad PF,6+ Mos 06/13/2015, 09/18/2016, 05/24/2019  . Tdap 12/06/2013    Recent Results (from the past 2160 hour(s))  Comprehensive metabolic panel     Status: None   Collection Time: 08/28/20 12:00 AM  Result Value Ref Range   Glucose, Bld 97 65 - 99 mg/dL    Comment: .            Fasting reference interval .    BUN 14 7 - 25 mg/dL   Creat 1.06 0.50 - 1.10 mg/dL   BUN/Creatinine Ratio NOT APPLICABLE 6 - 22 (calc)   Sodium 138 135 - 146 mmol/L   Potassium 4.3 3.5 - 5.3 mmol/L   Chloride 105 98 - 110 mmol/L   CO2 26 20 - 32 mmol/L   Calcium 9.6 8.6 - 10.2 mg/dL   Total Protein 6.8 6.1 - 8.1 g/dL   Albumin 4.3 3.6 - 5.1 g/dL   Globulin 2.5 1.9 - 3.7 g/dL (calc)   AG Ratio 1.7 1.0 - 2.5 (calc)   Total Bilirubin 0.8 0.2 - 1.2 mg/dL   Alkaline phosphatase (APISO) 87 31 - 125 U/L   AST 19 10 - 35 U/L   ALT 19 6 - 29 U/L  CBC     Status: None   Collection Time: 08/28/20 12:00 AM  Result Value Ref Range    WBC 5.7 3.8 - 10.8 Thousand/uL   RBC 4.64 3.80 - 5.10 Million/uL   Hemoglobin 13.9 11.7 - 15.5 g/dL   HCT 41.7 35.0 - 45.0 %   MCV 89.9 80.0 - 100.0 fL   MCH  30.0 27.0 - 33.0 pg   MCHC 33.3 32.0 - 36.0 g/dL   RDW 11.8 11.0 - 15.0 %   Platelets 352 140 - 400 Thousand/uL   MPV 10.4 7.5 - 12.5 fL  Lipid Profile     Status: Abnormal   Collection Time: 08/28/20 12:00 AM  Result Value Ref Range   Cholesterol 222 (H) <200 mg/dL   HDL 53 > OR = 50 mg/dL   Triglycerides 134 <150 mg/dL   LDL Cholesterol (Calc) 143 (H) mg/dL (calc)    Comment: Reference range: <100 . Desirable range <100 mg/dL for primary prevention;   <70 mg/dL for patients with CHD or diabetic patients  with > or = 2 CHD risk factors. Marland Kitchen LDL-C is now calculated using the Martin-Hopkins  calculation, which is a validated novel method providing  better accuracy than the Friedewald equation in the  estimation of LDL-C.  Cresenciano Genre et al. Annamaria Helling. 9935;701(77): 2061-2068  (http://education.[employer].com/faq/FAQ164)    Total CHOL/HDL Ratio 4.2 <5.0 (calc)   Non-HDL Cholesterol (Calc) 169 (H) <130 mg/dL (calc)    Comment: For patients with diabetes plus 1 major ASCVD risk  factor, treating to a non-HDL-C goal of <100 mg/dL  (LDL-C of <70 mg/dL) is considered a therapeutic  option.     No results found.     All questions at time of visit were answered - patient instructed to contact office with any additional concerns or updates. ER/RTC precautions were reviewed with the patient as applicable.   Please note: manual typing as well as voice recognition software may have been used to produce this document - typos may escape review. Please contact Dr. Sheppard Coil for any needed clarifications.

## 2020-10-23 NOTE — Patient Instructions (Signed)
Preventing High Cholesterol Cholesterol is a white, waxy substance similar to fat that the human body needs to help build cells. The liver makes all the cholesterol that a person's body needs. Having high cholesterol (hypercholesterolemia) increases your risk for heart disease and stroke. Extra or excess cholesterol comes from the food that you eat. High cholesterol can often be prevented with diet and lifestyle changes. If you already have high cholesterol, you can control it with diet, lifestyle changes, and medicines. How can high cholesterol affect me? If you have high cholesterol, fatty deposits (plaques) may build up on the walls of your blood vessels. The blood vessels that carry blood away from your heart are called arteries. Plaques make the arteries narrower and stiffer. This in turn can:  Restrict or block blood flow and cause blood clots to form.  Increase your risk for heart attack and stroke. What can increase my risk for high cholesterol? This condition is more likely to develop in people who:  Eat foods that are high in saturated fat or cholesterol. Saturated fat is mostly found in foods that come from animal sources.  Are overweight.  Are not getting enough exercise.  Have a family history of high cholesterol (familial hypercholesterolemia). What actions can I take to prevent this? Nutrition  Eat less saturated fat.  Avoid trans fats (partially hydrogenated oils). These are often found in margarine and in some baked goods, fried foods, and snacks bought in packages.  Avoid precooked or cured meat, such as bacon, sausages, or meat loaves.  Avoid foods and drinks that have added sugars.  Eat more fruits, vegetables, and whole grains.  Choose healthy sources of protein, such as fish, poultry, lean cuts of red meat, beans, peas, lentils, and nuts.  Choose healthy sources of fat, such as: ? Nuts. ? Vegetable oils, especially olive oil. ? Fish that have healthy fats,  such as omega-3 fatty acids. These fish include mackerel or salmon.   Lifestyle  Lose weight if you are overweight. Maintaining a healthy body mass index (BMI) can help prevent or control high cholesterol. It can also lower your risk for diabetes and high blood pressure. Ask your health care provider to help you with a diet and exercise plan to lose weight safely.  Do not use any products that contain nicotine or tobacco, such as cigarettes, e-cigarettes, and chewing tobacco. If you need help quitting, ask your health care provider. Alcohol use  Do not drink alcohol if: ? Your health care provider tells you not to drink. ? You are pregnant, may be pregnant, or are planning to become pregnant.  If you drink alcohol: ? Limit how much you use to:  0-1 drink a day for women.  0-2 drinks a day for men. ? Be aware of how much alcohol is in your drink. In the [place] one drink equals one 12 oz bottle of beer (355 mL), one 5 oz glass of wine (148 mL), or one 1 oz glass of hard liquor (44 mL). Activity  Get enough exercise. Do exercises as told by your health care provider.  Each week, do at least 150 minutes of exercise that takes a medium level of effort (moderate-intensity exercise). This kind of exercise: ? Makes your heart beat faster while allowing you to still be able to talk. ? Can be done in short sessions several times a day or longer sessions a few times a week. For example, on 5 days each week, you could walk fast or ride   your bike 3 times a day for 10 minutes each time.   Medicines  Your health care provider may recommend medicines to help lower cholesterol. This may be a medicine to lower the amount of cholesterol that your liver makes. You may need medicine if: ? Diet and lifestyle changes have not lowered your cholesterol enough. ? You have high cholesterol and other risk factors for heart disease or stroke.  Take over-the-counter and prescription medicines only as told by your  health care provider. General information  Manage your risk factors for high cholesterol. Talk with your health care provider about all your risk factors and how to lower your risk.  Manage other conditions that you have, such as diabetes or high blood pressure (hypertension).  Have blood tests to check your cholesterol levels at regular points in time as told by your health care provider.  Keep all follow-up visits as told by your health care provider. This is important. Where to find more information  American Heart Association: www.heart.org  National Heart, Lung, and Blood Institute: https://wilson-eaton.com/ Summary  High cholesterol increases your risk for heart disease and stroke. By keeping your cholesterol level low, you can reduce your risk for these conditions.  High cholesterol can often be prevented with diet and lifestyle changes.  Work with your health care provider to manage your risk factors, and have your blood tested regularly. This information is not intended to replace advice given to you by your health care provider. Make sure you discuss any questions you have with your health care provider. Document Revised: 05/18/2019 Document Reviewed: 05/18/2019 Elsevier Patient Education  2021 [place] Your Hypertension Hypertension, also called high blood pressure, is when the force of the blood pressing against the walls of the arteries is too strong. Arteries are blood vessels that carry blood from your heart throughout your body. Hypertension forces the heart to work harder to pump blood and may cause the arteries to become narrow or stiff. Understanding blood pressure readings Your personal target blood pressure may vary depending on your medical conditions, your age, and other factors. A blood pressure reading includes a higher number over a lower number. Ideally, your blood pressure should be below 120/80. You should know that:  The first, or top, number  is called the systolic pressure. It is a measure of the pressure in your arteries as your heart beats.  The second, or bottom number, is called the diastolic pressure. It is a measure of the pressure in your arteries as the heart relaxes. Blood pressure is classified into four stages. Based on your blood pressure reading, your health care provider may use the following stages to determine what type of treatment you need, if any. Systolic pressure and diastolic pressure are measured in a unit called mmHg. Normal  Systolic pressure: below 709.  Diastolic pressure: below 80. Elevated  Systolic pressure: 628-366.  Diastolic pressure: below 80. Hypertension stage 1  Systolic pressure: 294-765.  Diastolic pressure: 46-50. Hypertension stage 2  Systolic pressure: 354 or above.  Diastolic pressure: 90 or above. How can this condition affect me? Managing your hypertension is an important responsibility. Over time, hypertension can damage the arteries and decrease blood flow to important parts of the body, including the brain, heart, and kidneys. Having untreated or uncontrolled hypertension can lead to:  A heart attack.  A stroke.  A weakened blood vessel (aneurysm).  Heart failure.  Kidney damage.  Eye damage.  Metabolic syndrome.  Memory  and concentration problems.  Vascular dementia. What actions can I take to manage this condition? Hypertension can be managed by making lifestyle changes and possibly by taking medicines. Your health care provider will help you make a plan to bring your blood pressure within a normal range. Nutrition  Eat a diet that is high in fiber and potassium, and low in salt (sodium), added sugar, and fat. An example eating plan is called the Dietary Approaches to Stop Hypertension (DASH) diet. To eat this way: ? Eat plenty of fresh fruits and vegetables. Try to fill one-half of your plate at each meal with fruits and vegetables. ? Eat whole grains,  such as whole-wheat pasta, brown rice, or whole-grain bread. Fill about one-fourth of your plate with whole grains. ? Eat low-fat dairy products. ? Avoid fatty cuts of meat, processed or cured meats, and poultry with skin. Fill about one-fourth of your plate with lean proteins such as fish, chicken without skin, beans, eggs, and tofu. ? Avoid pre-made and processed foods. These tend to be higher in sodium, added sugar, and fat.  Reduce your daily sodium intake. Most people with hypertension should eat less than 1,500 mg of sodium a day.   Lifestyle  Work with your health care provider to maintain a healthy body weight or to lose weight. Ask what an ideal weight is for you.  Get at least 30 minutes of exercise that causes your heart to beat faster (aerobic exercise) most days of the week. Activities may include walking, swimming, or biking.  Include exercise to strengthen your muscles (resistance exercise), such as weight lifting, as part of your weekly exercise routine. Try to do these types of exercises for 30 minutes at least 3 days a week.  Do not use any products that contain nicotine or tobacco, such as cigarettes, e-cigarettes, and chewing tobacco. If you need help quitting, ask your health care provider.  Control any long-term (chronic) conditions you have, such as high cholesterol or diabetes.  Identify your sources of stress and find ways to manage stress. This may include meditation, deep breathing, or making time for fun activities.   Alcohol use  Do not drink alcohol if: ? Your health care provider tells you not to drink. ? You are pregnant, may be pregnant, or are planning to become pregnant.  If you drink alcohol: ? Limit how much you use to:  0-1 drink a day for women.  0-2 drinks a day for men. ? Be aware of how much alcohol is in your drink. In the [place] one drink equals one 12 oz bottle of beer (355 mL), one 5 oz glass of wine (148 mL), or one 1 oz glass of hard  liquor (44 mL). Medicines Your health care provider may prescribe medicine if lifestyle changes are not enough to get your blood pressure under control and if:  Your systolic blood pressure is 130 or higher.  Your diastolic blood pressure is 80 or higher. Take medicines only as told by your health care provider. Follow the directions carefully. Blood pressure medicines must be taken as told by your health care provider. The medicine does not work as well when you skip doses. Skipping doses also puts you at risk for problems. Monitoring Before you monitor your blood pressure:  Do not smoke, drink caffeinated beverages, or exercise within 30 minutes before taking a measurement.  Use the bathroom and empty your bladder (urinate).  Sit quietly for at least 5 minutes before taking measurements. Monitor  your blood pressure at home as told by your health care provider. To do this:  Sit with your back straight and supported.  Place your feet flat on the floor. Do not cross your legs.  Support your arm on a flat surface, such as a table. Make sure your upper arm is at heart level.  Each time you measure, take two or three readings one minute apart and record the results. You may also need to have your blood pressure checked regularly by your health care provider.   General information  Talk with your health care provider about your diet, exercise habits, and other lifestyle factors that may be contributing to hypertension.  Review all the medicines you take with your health care provider because there may be side effects or interactions.  Keep all visits as told by your health care provider. Your health care provider can help you create and adjust your plan for managing your high blood pressure. Where to find more information  National Heart, Lung, and Blood Institute: https://wilson-eaton.com/  American Heart Association: www.heart.org Contact a health care provider if:  You think you are  having a reaction to medicines you have taken.  You have repeated (recurrent) headaches.  You feel dizzy.  You have swelling in your ankles.  You have trouble with your vision. Get help right away if:  You develop a severe headache or confusion.  You have unusual weakness or numbness, or you feel faint.  You have severe pain in your chest or abdomen.  You vomit repeatedly.  You have trouble breathing. These symptoms may represent a serious problem that is an emergency. Do not wait to see if the symptoms will go away. Get medical help right away. Call your local emergency services (911 in the [place] Do not drive yourself to the hospital. Summary  Hypertension is when the force of blood pumping through your arteries is too strong. If this condition is not controlled, it may put you at risk for serious complications.  Your personal target blood pressure may vary depending on your medical conditions, your age, and other factors. For most people, a normal blood pressure is less than 120/80.  Hypertension is managed by lifestyle changes, medicines, or both.  Lifestyle changes to help manage hypertension include losing weight, eating a healthy, low-sodium diet, exercising more, stopping smoking, and limiting alcohol. This information is not intended to replace advice given to you by your health care provider. Make sure you discuss any questions you have with your health care provider. Document Revised: 09/10/2019 Document Reviewed: 07/06/2019 Elsevier Patient Education  2021 [employer].

## 2021-01-10 ENCOUNTER — Telehealth: Payer: Self-pay | Admitting: Osteopathic Medicine

## 2021-01-10 DIAGNOSIS — D1803 Hemangioma of intra-abdominal structures: Secondary | ICD-10-CM

## 2021-01-10 NOTE — Telephone Encounter (Signed)
-----   Message from Emeterio Reeve, DO sent at 06/26/2020  1:05 PM EST ----- MRI w/wo contrast to f/u liver lesion likely benign  Generated 06/26/20 to alert me and triage in 6 mos

## 2021-01-16 ENCOUNTER — Ambulatory Visit (INDEPENDENT_AMBULATORY_CARE_PROVIDER_SITE_OTHER): Payer: BC Managed Care – PPO

## 2021-01-16 ENCOUNTER — Encounter: Payer: Self-pay | Admitting: Osteopathic Medicine

## 2021-01-16 ENCOUNTER — Other Ambulatory Visit: Payer: Self-pay

## 2021-01-16 DIAGNOSIS — N281 Cyst of kidney, acquired: Secondary | ICD-10-CM | POA: Diagnosis not present

## 2021-01-16 DIAGNOSIS — Z9049 Acquired absence of other specified parts of digestive tract: Secondary | ICD-10-CM | POA: Diagnosis not present

## 2021-01-16 DIAGNOSIS — D1803 Hemangioma of intra-abdominal structures: Secondary | ICD-10-CM | POA: Diagnosis not present

## 2021-01-16 DIAGNOSIS — K769 Liver disease, unspecified: Secondary | ICD-10-CM | POA: Diagnosis not present

## 2021-01-16 MED ORDER — GADOBUTROL 1 MMOL/ML IV SOLN
7.5000 mL | Freq: Once | INTRAVENOUS | Status: AC | PRN
  Administered 2021-01-16: 7.5 mL via INTRAVENOUS

## 2021-08-16 ENCOUNTER — Encounter: Payer: Self-pay | Admitting: Family Medicine

## 2021-08-16 ENCOUNTER — Ambulatory Visit: Payer: BC Managed Care – PPO | Admitting: Family Medicine

## 2021-08-16 ENCOUNTER — Other Ambulatory Visit: Payer: Self-pay

## 2021-08-16 VITALS — BP 155/97 | HR 74 | Temp 98.2°F | Ht 61.0 in | Wt 175.0 lb

## 2021-08-16 DIAGNOSIS — D1803 Hemangioma of intra-abdominal structures: Secondary | ICD-10-CM | POA: Diagnosis not present

## 2021-08-16 DIAGNOSIS — R030 Elevated blood-pressure reading, without diagnosis of hypertension: Secondary | ICD-10-CM

## 2021-08-16 DIAGNOSIS — Z1211 Encounter for screening for malignant neoplasm of colon: Secondary | ICD-10-CM | POA: Diagnosis not present

## 2021-08-16 DIAGNOSIS — K769 Liver disease, unspecified: Secondary | ICD-10-CM | POA: Diagnosis not present

## 2021-08-16 DIAGNOSIS — R002 Palpitations: Secondary | ICD-10-CM

## 2021-08-16 DIAGNOSIS — G4733 Obstructive sleep apnea (adult) (pediatric): Secondary | ICD-10-CM | POA: Diagnosis not present

## 2021-08-16 DIAGNOSIS — R748 Abnormal levels of other serum enzymes: Secondary | ICD-10-CM

## 2021-08-16 NOTE — Assessment & Plan Note (Signed)
Intermittent palpitations.  He will be related to untreated sleep apnea.  Checking TSH today as well.

## 2021-08-16 NOTE — Assessment & Plan Note (Signed)
Blood pressure elevated today however this is not uncommon for her.  Readings at home have been well controlled.

## 2021-08-16 NOTE — Progress Notes (Signed)
Jessica Richmond - 45 y.o. female MRN [id number]  Date of birth: April 20, 1976  Subjective Chief Complaint  Patient presents with   Transitions Of Care    HPI Jessica Richmond is a 45 year old female here today for follow-up visit.  She is a former patient of Dr. Sheppard Coil.  She has history of liver hemangioma and is due for updated MRI of the liver for follow-up of this.  She has noticed some intermittent palpitations.  Mother has history of atrial fibrillation she is concerned that she may have developed this.  She did have sleep apnea on previous sleep study however has not started anything for treatment.  She would like referral to sleep specialist.  She is due for colon cancer screening.  She would like to have colonoscopy.  ROS:  A comprehensive ROS was completed and negative except as noted per HPI  Allergies  Allergen Reactions   Keflex [Cephalexin] Hives   Indomethacin Nausea Only, Other (See Comments) and Nausea And Vomiting    Dizziness   Milk-Related Compounds    Shrimp [Shellfish Allergy]    Wheat Bran     Past Medical History:  Diagnosis Date   Abnormal Pap smear of cervix    ASCUS retested neg   Allergic rhinitis    Hemangioma    LIVER   History of kidney stones    CYST   Multiple food allergies    wheat, Milk, shrimp   Pneumonia    Vitamin B 12 deficiency     Past Surgical History:  Procedure Laterality Date   CHOLECYSTECTOMY N/A 10/18/2019   Procedure: LAPAROSCOPIC CHOLECYSTECTOMY;  Surgeon: Greer Pickerel, MD;  Location: [facility];  Service: General;  Laterality: N/A;   HIP ARTHROSCOPY Left    torn labrumin   WISDOM TOOTH EXTRACTION      Social History   Socioeconomic History   Marital status: Single    Spouse name: Not on file   Number of children: 0   Years of education: Not on file   Highest education level: Not on file  Occupational History   Occupation: arts admin    Employer: [employer]  Tobacco Use   Smoking status: Never   Smokeless tobacco: Never   Vaping Use   Vaping Use: Never used  Substance and Sexual Activity   Alcohol use: Yes    Comment: social   Drug use: No   Sexual activity: Not Currently    Partners: Male    Birth control/protection: None, I.U.D.  Other Topics Concern   Not on file  Social History Narrative   Not on file   Social Determinants of Health   Financial Resource Strain: Not on file  Food Insecurity: Not on file  Transportation Needs: Not on file  Physical Activity: Not on file  Stress: Not on file  Social Connections: Not on file    Family History  Problem Relation Age of Onset   Heart disease Mother 62       A fib   Fibrocystic breast disease Mother        partial mastectomy   Hypertension Father    Diabetes Father    Diverticulosis Father    Depression Father    Crohn's disease Sister    Ovarian cysts Sister    Raynaud syndrome Sister    Depression Sister    Uterine cancer Other        maternal great grandmother    Health Maintenance  Topic Date Due   COVID-19 Vaccine (1) Never  done   Pneumococcal Vaccine 99-67 Years old (1 - PCV) Never done   HIV Screening  Never done   Hepatitis C Screening  Never done   COLONOSCOPY (Pts 45-75yrs Insurance coverage will need to be confirmed)  Never done   PAP SMEAR-Modifier  05/23/2022   TETANUS/TDAP  12/07/2023   INFLUENZA VACCINE  Completed   HPV VACCINES  Aged Out     ----------------------------------------------------------------------------------------------------------------------------------------------------------------------------------------------------------------- Physical Exam BP (!) 155/97 (BP Location: Left Arm, Patient Position: Sitting, Cuff Size: Normal)    Pulse 74    Temp 98.2 F (36.8 C)    Ht 5\' 1"  (1.549 m)    Wt 175 lb (79.4 kg)    SpO2 100%    BMI 33.07 kg/m   Physical Exam Constitutional:      Appearance: Normal appearance.  HENT:     Head: Normocephalic and atraumatic.  Eyes:     General: No scleral  icterus. Cardiovascular:     Rate and Rhythm: Normal rate and regular rhythm.  Pulmonary:     Effort: Pulmonary effort is normal.     Breath sounds: Normal breath sounds.  Musculoskeletal:     Cervical back: Neck supple.  Neurological:     Mental Status: She is alert.  Psychiatric:        Mood and Affect: Mood normal.        Behavior: Behavior normal.    ------------------------------------------------------------------------------------------------------------------------------------------------------------------------------------------------------------------- Assessment and Plan  White coat syndrome without diagnosis of hypertension Blood pressure elevated today however this is not uncommon for her.  Readings at home have been well controlled.  Obstructive sleep apnea syndrome Referral placed to sleep specialist  Elevated liver enzymes Due for follow-up of hemangioma.  Updated MRI with and without contrast ordered.  Palpitations Intermittent palpitations.  He will be related to untreated sleep apnea.  Checking TSH today as well.   No orders of the defined types were placed in this encounter.   No follow-ups on file.    This visit occurred during the SARS-CoV-2 public health emergency.  Safety protocols were in place, including screening questions prior to the visit, additional usage of staff PPE, and extensive cleaning of exam room while observing appropriate contact time as indicated for disinfecting solutions.

## 2021-08-16 NOTE — Assessment & Plan Note (Signed)
Referral placed to sleep specialist

## 2021-08-16 NOTE — Assessment & Plan Note (Signed)
Due for follow-up of hemangioma.  Updated MRI with and without contrast ordered.

## 2021-08-17 LAB — BASIC METABOLIC PANEL
BUN: 13 mg/dL (ref 7–25)
CO2: 28 mmol/L (ref 20–32)
Calcium: 9.8 mg/dL (ref 8.6–10.2)
Chloride: 106 mmol/L (ref 98–110)
Creat: 0.99 mg/dL (ref 0.50–0.99)
Glucose, Bld: 92 mg/dL (ref 65–99)
Potassium: 4.8 mmol/L (ref 3.5–5.3)
Sodium: 141 mmol/L (ref 135–146)

## 2021-08-17 LAB — TSH+FREE T4: TSH W/REFLEX TO FT4: 2.27 mIU/L

## 2021-08-21 ENCOUNTER — Ambulatory Visit (INDEPENDENT_AMBULATORY_CARE_PROVIDER_SITE_OTHER): Payer: BC Managed Care – PPO

## 2021-08-21 ENCOUNTER — Other Ambulatory Visit: Payer: Self-pay

## 2021-08-21 DIAGNOSIS — Z09 Encounter for follow-up examination after completed treatment for conditions other than malignant neoplasm: Secondary | ICD-10-CM

## 2021-08-21 DIAGNOSIS — Z9049 Acquired absence of other specified parts of digestive tract: Secondary | ICD-10-CM | POA: Diagnosis not present

## 2021-08-21 DIAGNOSIS — D171 Benign lipomatous neoplasm of skin and subcutaneous tissue of trunk: Secondary | ICD-10-CM | POA: Diagnosis not present

## 2021-08-21 DIAGNOSIS — N281 Cyst of kidney, acquired: Secondary | ICD-10-CM | POA: Diagnosis not present

## 2021-08-21 DIAGNOSIS — K7689 Other specified diseases of liver: Secondary | ICD-10-CM | POA: Diagnosis not present

## 2021-08-21 DIAGNOSIS — K769 Liver disease, unspecified: Secondary | ICD-10-CM | POA: Diagnosis not present

## 2021-08-21 MED ORDER — GADOXETATE DISODIUM 0.25 MMOL/ML IV SOLN
8.0000 mL | Freq: Once | INTRAVENOUS | Status: AC | PRN
  Administered 2021-08-21: 8 mL via INTRAVENOUS

## 2021-09-06 ENCOUNTER — Other Ambulatory Visit: Payer: Self-pay | Admitting: Obstetrics & Gynecology

## 2021-09-06 DIAGNOSIS — Z1231 Encounter for screening mammogram for malignant neoplasm of breast: Secondary | ICD-10-CM

## 2021-09-13 ENCOUNTER — Ambulatory Visit (INDEPENDENT_AMBULATORY_CARE_PROVIDER_SITE_OTHER): Payer: BC Managed Care – PPO

## 2021-09-13 ENCOUNTER — Other Ambulatory Visit: Payer: Self-pay

## 2021-09-13 DIAGNOSIS — Z1231 Encounter for screening mammogram for malignant neoplasm of breast: Secondary | ICD-10-CM

## 2021-09-14 ENCOUNTER — Encounter: Payer: Self-pay | Admitting: Family Medicine

## 2021-09-14 ENCOUNTER — Other Ambulatory Visit: Payer: Self-pay | Admitting: Obstetrics & Gynecology

## 2021-09-14 DIAGNOSIS — R928 Other abnormal and inconclusive findings on diagnostic imaging of breast: Secondary | ICD-10-CM

## 2021-09-30 NOTE — Progress Notes (Signed)
10/01/21- 35 yoF never smoker for sleep evaluation courtesy of Dr Luetta Nutting with concern of OSA Medical problem list includes Allergic Rhinitis, OSA, Hyperlipidemia, Multiple Food Allergies,  HST 05/15/20- AHI 8/ hr, desaturation to 81%, body weight 171 lbs Epworth score-13 Body weight today-176 lbs Covid vax-3 Moderna Flu vax-had -----Patient did HST about a year ago and did not follow through with next steps. Patient snores very loud and wakes herself up at night. Wakes up 4-5 times a night. Is not sure if she stops breathing.  Original sleep study was done because of loud snoring and nonrestorative sleep with daytime tiredness.  No history of ENT surgery.  Father has OSA, mother has A-fib.  Patient has some history of intermittent palpitation as well.  She denies lung disease but has been told she has a heart murmur. 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  Prior to Admission medications   Medication Sig Start Date End Date Taking? Authorizing Provider  cetirizine (ZYRTEC) 10 MG tablet Take 10 mg by mouth daily.   Yes [provider]  cyanocobalamin 1000 MCG tablet Take 1,000 mcg by mouth daily.   Yes [provider]  levonorgestrel (MIRENA) 20 MCG/24HR IUD 1 each by Intrauterine route once.   Yes [provider]  Vitamin D, Cholecalciferol, 25 MCG (1000 UT) TABS Take 1,000 Units by mouth daily.   Yes [provider]   Past Medical History:  Diagnosis Date   Abnormal Pap smear of cervix    ASCUS retested neg   Allergic rhinitis    Hemangioma    LIVER   History of kidney stones    CYST   Multiple food allergies    wheat, Milk, shrimp   Pneumonia    Vitamin B 12 deficiency    Past Surgical History:  Procedure Laterality Date   CHOLECYSTECTOMY N/A 10/18/2019   Procedure: LAPAROSCOPIC CHOLECYSTECTOMY;  Surgeon: Greer Pickerel, MD;  Location: [facility];  Service: General;  Laterality: N/A;   HIP ARTHROSCOPY  Left    torn labrumin   WISDOM TOOTH EXTRACTION     Family History  Problem Relation Age of Onset   Heart disease Mother 24       A fib   Fibrocystic breast disease Mother        partial mastectomy   Hypertension Father    Diabetes Father    Diverticulosis Father    Depression Father    Crohn's disease Sister    Ovarian cysts Sister    Raynaud syndrome Sister    Depression Sister    Uterine cancer Other        maternal great grandmother   Social History   Socioeconomic History   Marital status: Single    Spouse name: Not on file   Number of children: 0   Years of education: Not on file   Highest education level: Not on file  Occupational History   Occupation: arts admin    Employer: [employer]  Tobacco Use   Smoking status: Never   Smokeless tobacco: Never  Vaping Use   Vaping Use: Never used  Substance and Sexual Activity   Alcohol use: Yes    Comment: social   Drug use: No   Sexual activity: Not Currently    Partners: Male    Birth control/protection: None, I.U.D.  Other Topics Concern   Not on file  Social History Narrative   Not on file   Social Determinants of Health  Financial Resource Strain: Not on file  Food Insecurity: Not on file  Transportation Needs: Not on file  Physical Activity: Not on file  Stress: Not on file  Social Connections: Not on file  Intimate Partner Violence: Not on file   ROS-see HPI   + = positive Constitutional:    weight loss, night sweats, fevers, chills, fatigue, lassitude. HEENT:    headaches, difficulty swallowing, tooth/dental problems, sore throat,       sneezing, itching, ear ache, nasal congestion, post nasal drip, snoring CV:    chest pain, orthopnea, PND, swelling in lower extremities, anasarca, dizziness, +palpitations Resp:   shortness of breath with exertion or at rest.                productive cough,   non-productive cough, coughing up of blood.              change in color of mucus.  wheezing.    Skin:    rash or lesions. GI:  No-   heartburn, indigestion, abdominal pain, nausea, vomiting, diarrhea,                 change in bowel habits, loss of appetite GU: dysuria, change in color of urine, no urgency or frequency.   flank pain. MS:   joint pain, stiffness, decreased range of motion, back pain. Neuro-     nothing unusual Psych:  change in mood or affect.  depression or anxiety.   memory loss.  OBJ- Physical Exam General- Alert, Oriented, Affect-appropriate, Distress- none acute+ overweight Skin- rash-none, lesions- none, excoriation- none Lymphadenopathy- none Head- atraumatic            Eyes- Gross vision intact, PERRLA, conjunctivae and secretions clear            Ears- Hearing, canals-normal            Nose- Clear, no-Septal dev, mucus, polyps, erosion, perforation             Throat- Mallampati III , mucosa clear , drainage- none, tonsils- atrophic, + teeth Neck- flexible , trachea midline, no stridor , thyroid nl, carotid no bruit Chest - symmetrical excursion , unlabored           Heart/CV- RRR , no murmur , no gallop  , no rub, nl s1 s2                           - JVD- none , edema- none, stasis changes- none, varices- none           Lung- clear to P&A, wheeze- none, cough- none , dullness-none, rub- none           Chest wall-  Abd-  Br/ Gen/ Rectal- Not done, not indicated Extrem- cyanosis- none, clubbing, none, atrophy- none, strength- nl Neuro- grossly intact to observation

## 2021-10-01 ENCOUNTER — Encounter: Payer: Self-pay | Admitting: Internal Medicine

## 2021-10-01 ENCOUNTER — Other Ambulatory Visit: Payer: Self-pay

## 2021-10-01 ENCOUNTER — Ambulatory Visit: Payer: BC Managed Care – PPO | Admitting: Internal Medicine

## 2021-10-01 VITALS — BP 140/88 | HR 79 | Temp 98.1°F | Ht 61.0 in | Wt 176.2 lb

## 2021-10-01 DIAGNOSIS — R002 Palpitations: Secondary | ICD-10-CM | POA: Diagnosis not present

## 2021-10-01 DIAGNOSIS — R635 Abnormal weight gain: Secondary | ICD-10-CM

## 2021-10-01 DIAGNOSIS — G4733 Obstructive sleep apnea (adult) (pediatric): Secondary | ICD-10-CM

## 2021-10-01 NOTE — Assessment & Plan Note (Signed)
We discussed options and alternatives.  Reviewed the basics of OSA, testing, treatment options, medical concerns.  She decided she would like to update sleep study since she has gained weight, before deciding about a treatment direction.  Symptoms of primary concern are loud snoring, palpitations and daytime tiredness. Plan-update sleep study

## 2021-10-01 NOTE — Assessment & Plan Note (Signed)
Mother has atrial fibrillation.  We discussed association between atrial fibrillation and sleep apnea.

## 2021-10-01 NOTE — Patient Instructions (Signed)
Order- schedule home sleep test    dx OSA  Please call us about 2 weeks after your sleep tet for results and recommendations

## 2021-10-01 NOTE — Assessment & Plan Note (Signed)
She accepts normalization of body weight as a valuable target while she considers treatment options for sleep apnea.

## 2021-10-05 ENCOUNTER — Ambulatory Visit (AMBULATORY_SURGERY_CENTER): Payer: Self-pay

## 2021-10-05 ENCOUNTER — Other Ambulatory Visit: Payer: Self-pay

## 2021-10-05 VITALS — Ht 61.0 in | Wt 172.0 lb

## 2021-10-05 DIAGNOSIS — Z1211 Encounter for screening for malignant neoplasm of colon: Secondary | ICD-10-CM

## 2021-10-05 MED ORDER — NA SULFATE-K SULFATE-MG SULF 17.5-3.13-1.6 GM/177ML PO SOLN: 1.0000 | Freq: Once | ORAL | 0 refills | Status: AC

## 2021-10-05 NOTE — Progress Notes (Signed)
Denies allergies to eggs or soy products. Denies complication of anesthesia or sedation. Denies use of weight loss medication. Denies use of O2.   Emmi instructions given for colonoscopy.  

## 2021-10-10 ENCOUNTER — Ambulatory Visit
Admission: RE | Admit: 2021-10-10 | Discharge: 2021-10-10 | Disposition: A | Discharge status: Home / Self Care | Payer: BC Managed Care – PPO | Source: Ambulatory Visit | Attending: Obstetrics & Gynecology | Admitting: Obstetrics & Gynecology

## 2021-10-10 ENCOUNTER — Ambulatory Visit: Payer: BC Managed Care – PPO

## 2021-10-10 DIAGNOSIS — R922 Inconclusive mammogram: Secondary | ICD-10-CM | POA: Diagnosis not present

## 2021-10-10 DIAGNOSIS — N6489 Other specified disorders of breast: Secondary | ICD-10-CM | POA: Diagnosis not present

## 2021-10-10 DIAGNOSIS — R928 Other abnormal and inconclusive findings on diagnostic imaging of breast: Secondary | ICD-10-CM | POA: Diagnosis not present

## 2021-10-16 ENCOUNTER — Encounter: Payer: Self-pay | Admitting: Internal Medicine

## 2021-10-19 ENCOUNTER — Other Ambulatory Visit: Payer: Self-pay

## 2021-10-19 ENCOUNTER — Ambulatory Visit (AMBULATORY_SURGERY_CENTER): Payer: BC Managed Care – PPO | Admitting: Internal Medicine

## 2021-10-19 ENCOUNTER — Encounter: Payer: Self-pay | Admitting: Internal Medicine

## 2021-10-19 VITALS — BP 127/79 | HR 61 | Temp 98.4°F | Resp 11 | Ht 61.0 in | Wt 172.0 lb

## 2021-10-19 DIAGNOSIS — Z1211 Encounter for screening for malignant neoplasm of colon: Secondary | ICD-10-CM

## 2021-10-19 DIAGNOSIS — K635 Polyp of colon: Secondary | ICD-10-CM

## 2021-10-19 DIAGNOSIS — D120 Benign neoplasm of cecum: Secondary | ICD-10-CM

## 2021-10-19 MED ORDER — SODIUM CHLORIDE 0.9 % IV SOLN: 500.0000 mL | Freq: Once | INTRAVENOUS | Status: DC

## 2021-10-19 NOTE — Patient Instructions (Addendum)
Handouts given for polyps and diverticulosis. ? ?YOU HAD AN ENDOSCOPIC PROCEDURE TODAY AT [facility]:   Refer to the procedure report that was given to you for any specific questions about what was found during the examination.  If the procedure report does not answer your questions, please call your gastroenterologist to clarify.  If you requested that your care partner not be given the details of your procedure findings, then the procedure report has been included in a sealed envelope for you to review at your convenience later. ? ?YOU SHOULD EXPECT: Some feelings of bloating in the abdomen. Passage of more gas than usual.  Walking can help get rid of the air that was put into your GI tract during the procedure and reduce the bloating. If you had a lower endoscopy (such as a colonoscopy or flexible sigmoidoscopy) you may notice spotting of blood in your stool or on the toilet paper. If you underwent a bowel prep for your procedure, you may not have a normal bowel movement for a few days. ? ?Please Note:  You might notice some irritation and congestion in your nose or some drainage.  This is from the oxygen used during your procedure.  There is no need for concern and it should clear up in a day or so. ? ?SYMPTOMS TO REPORT IMMEDIATELY: ? ?Following lower endoscopy (colonoscopy): ? Excessive amounts of blood in the stool ? Significant tenderness or worsening of abdominal pains ? Swelling of the abdomen that is new, acute ? Fever of 100?F or higher ? ?For urgent or emergent issues, a gastroenterologist can be reached at any hour by calling [phone]. ?Do not use MyChart messaging for urgent concerns.  ? ?DIET:  We do recommend a small meal at first, but then you may proceed to your regular diet.  Drink plenty of fluids but you should avoid alcoholic beverages for 24 hours. ? ?ACTIVITY:  You should plan to take it easy for the rest of today and you should NOT DRIVE or use heavy machinery until  tomorrow (because of the sedation medicines used during the test).   ? ?FOLLOW UP: ?Our staff will call the number listed on your records 48-72 hours following your procedure to check on you and address any questions or concerns that you may have regarding the information given to you following your procedure. If we do not reach you, we will leave a message.  We will attempt to reach you two times.  During this call, we will ask if you have developed any symptoms of COVID 19. If you develop any symptoms (ie: fever, flu-like symptoms, shortness of breath, cough etc.) before then, please call [phone].  If you test positive for Covid 19 in the 2 weeks post procedure, please call and report this information to [place].   ? ?If any biopsies were taken you will be contacted by phone or by letter within the next 1-3 weeks.  Please call us at [phone] if you have not heard about the biopsies in 3 weeks.  ? ? ?SIGNATURES/CONFIDENTIALITY: ?You and/or your care partner have signed paperwork which will be entered into your electronic medical record.  These signatures attest to the fact that that the information above on your After Visit Summary has been reviewed and is understood.  Full responsibility of the confidentiality of this discharge information lies with you and/or your care-partner.  ?

## 2021-10-19 NOTE — Op Note (Signed)
[place] ?Patient Name: Jessica Richmond ?Procedure Date: 10/19/2021 10:10 AM ?MRN: [id number] ?Endoscopist: Docia Chuck. Henrene Pastor , MD ?Age: 46 ?Referring MD:  ?Date of Birth: 14-Feb-1976 ?Gender: Female ?Account #: [id number] ?Procedure:                Colonoscopy ?Indications:              Screening for colorectal malignant neoplasm ?Medicines:                Monitored Anesthesia Care ?Procedure:                Pre-Anesthesia Assessment: ?                          - Prior to the procedure, a History and Physical  ?                          was performed, and patient medications and  ?                          allergies were reviewed. The patient's tolerance of  ?                          previous anesthesia was also reviewed. The risks  ?                          and benefits of the procedure and the sedation  ?                          options and risks were discussed with the patient.  ?                          All questions were answered, and informed consent  ?                          was obtained. Prior Anticoagulants: The patient has  ?                          taken no previous anticoagulant or antiplatelet  ?                          agents. ASA Grade Assessment: II - A patient with  ?                          mild systemic disease. After reviewing the risks  ?                          and benefits, the patient was deemed in  ?                          satisfactory condition to undergo the procedure. ?                          After obtaining informed consent, the colonoscope  ?  was passed under direct vision. Throughout the  ?                          procedure, the patient's blood pressure, pulse, and  ?                          oxygen saturations were monitored continuously. The  ?                          Olympus CF-HQ190L ([id number]) Colonoscope was  ?                          introduced through the anus and advanced to the the  ?                          cecum, identified by  appendiceal orifice and  ?                          ileocecal valve. The ileocecal valve, appendiceal  ?                          orifice, and rectum were photographed. The quality  ?                          of the bowel preparation was excellent. The  ?                          colonoscopy was performed without difficulty. The  ?                          patient tolerated the procedure well. The bowel  ?                          preparation used was SUPREP via split dose  ?                          instruction. ?Scope In: 10:25:03 AM ?Scope Out: 10:38:45 AM ?Scope Withdrawal Time: 0 hours 10 minutes 22 seconds  ?Total Procedure Duration: 0 hours 13 minutes 42 seconds  ?Findings:                 A 4 mm polyp was found in the cecum. The polyp was  ?                          sessile. The polyp was removed with a cold snare.  ?                          Resection and retrieval were complete. ?                          A few diverticula were found in the sigmoid colon  ?                          and ascending colon. ?  The exam was otherwise without abnormality on  ?                          direct and retroflexion views. ?Complications:            No immediate complications. Estimated blood loss:  ?                          None. ?Estimated Blood Loss:     Estimated blood loss: none. ?Impression:               - One 4 mm polyp in the cecum, removed with a cold  ?                          snare. Resected and retrieved. ?                          - Diverticulosis in the sigmoid colon and in the  ?                          ascending colon. ?                          - The examination was otherwise normal on direct  ?                          and retroflexion views. ?Recommendation:           - Repeat colonoscopy in 5-10 years for surveillance. ?                          - Patient has a contact number available for  ?                          emergencies. The signs and symptoms of potential  ?                           delayed complications were discussed with the  ?                          patient. Return to normal activities tomorrow.  ?                          Written discharge instructions were provided to the  ?                          patient. ?                          - Resume previous diet. ?                          - Continue present medications. ?                          - Await pathology results. ?Docia Chuck. Henrene Pastor, MD ?10/19/2021 10:43:11 AM ?This report has been signed electronically. ?

## 2021-10-19 NOTE — Progress Notes (Signed)
HISTORY OF PRESENT ILLNESS: ? ?Jessica Richmond is a 46 y.o. female who presents today for routine screening colonoscopy. ? ?REVIEW OF SYSTEMS: ? ?All non-GI ROS negative. ?Past Medical History:  ?Diagnosis Date  ? Abnormal Pap smear of cervix   ? ASCUS retested neg  ? Allergic rhinitis   ? Allergy   ? Heart murmur   ? Hemangioma   ? LIVER  ? History of kidney stones   ? CYST  ? Multiple food allergies   ? wheat, Milk, shrimp  ? Pneumonia   ? Vitamin B 12 deficiency   ? ? ?Past Surgical History:  ?Procedure Laterality Date  ? CHOLECYSTECTOMY N/A 10/18/2019  ? Procedure: LAPAROSCOPIC CHOLECYSTECTOMY;  Surgeon: Greer Pickerel, MD;  Location: [facility];  Service: General;  Laterality: N/A;  ? HIP ARTHROSCOPY Left   ? torn labrumin  ? WISDOM TOOTH EXTRACTION    ? ? ?Social History ?Jessica Richmond  reports that she has never smoked. She has never used smokeless tobacco. She reports current alcohol use. She reports that she does not use drugs. ? ?family history includes Crohn's disease in her sister; Depression in her father and sister; Diabetes in her father; Diverticulosis in her father; Fibrocystic breast disease in her mother; Heart disease (age of onset: 79) in her mother; Hypertension in her father; Ovarian cysts in her sister; Raynaud syndrome in her sister; Uterine cancer in an other family member. ? ?Allergies  ?Allergen Reactions  ? Keflex [Cephalexin] Hives  ? Indomethacin Nausea Only, Other (See Comments) and Nausea And Vomiting  ?  Dizziness  ? Milk-Related Compounds   ? Shrimp [Shellfish Allergy]   ? Wheat Bran   ? ? ?  ? ?PHYSICAL EXAMINATION: ? ?Vital signs: There were no vitals taken for this visit. ?General: Well-developed, well-nourished, no acute distress ?HEENT: Sclerae are anicteric, conjunctiva pink. Oral mucosa intact ?Lungs: Clear ?Heart: Regular ?Abdomen: soft, nontender, nondistended, no obvious ascites, no peritoneal signs, normal bowel sounds. No organomegaly. ?Extremities: No edema ?Psychiatric: alert  and oriented x3. Cooperative  ? ? ? ?ASSESSMENT: ? ?Colon cancer screening ? ? ?PLAN: ? ? ?Screening colonoscopy ? ? ? ?  ?

## 2021-10-19 NOTE — Progress Notes (Signed)
Sedate, gd SR, tolerated procedure well, VSS, report to RN 

## 2021-10-19 NOTE — Progress Notes (Signed)
Pt's states no medical or surgical changes since previsit or office visit. VS by CW. 

## 2021-10-19 NOTE — Progress Notes (Signed)
Called to room to assist during endoscopic procedure.  Patient ID and intended procedure confirmed with present staff. Received instructions for my participation in the procedure from the performing physician.  

## 2021-10-23 ENCOUNTER — Telehealth: Payer: Self-pay

## 2021-10-23 NOTE — Telephone Encounter (Signed)
?  Follow up Call- ? ?Call back number 10/19/2021  ?Post procedure Call Back phone  # [phone]  ?Permission to leave phone message Yes  ?Some recent data might be hidden  ?  ? ?Patient questions: ? ?Do you have a fever, pain , or abdominal swelling? No. ?Pain Score  0 * ? ?Have you tolerated food without any problems? Yes.   ? ?Have you been able to return to your normal activities? Yes.   ? ?Do you have any questions about your discharge instructions: ?Diet   No. ?Medications  No. ?Follow up visit  No. ? ?Do you have questions or concerns about your Care? No. ? ?Actions: ?* If pain score is 4 or above: ?No action needed, pain <4. ? ? ?

## 2021-10-26 ENCOUNTER — Encounter: Payer: Self-pay | Admitting: Internal Medicine

## 2021-12-03 ENCOUNTER — Ambulatory Visit: Payer: BC Managed Care – PPO | Admitting: Obstetrics & Gynecology

## 2021-12-03 ENCOUNTER — Other Ambulatory Visit (HOSPITAL_COMMUNITY)
Admission: RE | Admit: 2021-12-03 | Discharge: 2021-12-03 | Disposition: A | Discharge status: Home / Self Care | Payer: BC Managed Care – PPO | Source: Ambulatory Visit | Attending: Obstetrics & Gynecology | Admitting: Obstetrics & Gynecology

## 2021-12-03 ENCOUNTER — Encounter: Payer: Self-pay | Admitting: Obstetrics & Gynecology

## 2021-12-03 VITALS — BP 174/96 | HR 65 | Ht 61.0 in | Wt 176.0 lb

## 2021-12-03 DIAGNOSIS — N898 Other specified noninflammatory disorders of vagina: Secondary | ICD-10-CM | POA: Insufficient documentation

## 2021-12-03 NOTE — Progress Notes (Addendum)
? ?  Subjective:  ? ? Patient ID: Jessica Richmond, female    DOB: 09-17-1975, 46 y.o.   MRN: [id number] ? ?HPI ? ?Jessica Richmond is a 46 year old G61, P0 female who uses an IUD for contraception and presents with a milky discharge with a slight odor for the past couple months.  She has tried 2 over-the-counter yeast treatments without resolution of symptoms. ? ?Review of Systems  ?Constitutional: Negative.   ?Respiratory: Negative.    ?Cardiovascular: Negative.   ?Gastrointestinal: Negative.   ?Genitourinary:  Positive for vaginal discharge.  ? ?   ?Objective:  ? Physical Exam ?Vitals reviewed.  ?Constitutional:   ?   General: She is not in acute distress. ?   Appearance: She is well-developed.  ?HENT:  ?   Head: Normocephalic and atraumatic.  ?Eyes:  ?   Conjunctiva/sclera: Conjunctivae normal.  ?Cardiovascular:  ?   Rate and Rhythm: Normal rate.  ?Pulmonary:  ?   Effort: Pulmonary effort is normal.  ?Genitourinary: ?   General: Normal vulva.  ?   Vagina: No vaginal discharge.  ?Skin: ?   General: Skin is warm and dry.  ?Neurological:  ?   Mental Status: She is alert and oriented to person, place, and time.  ?Psychiatric:     ?   Mood and Affect: Mood normal.  ? ?   ?Assessment & Plan:  ?46 year old female with vaginal discharge that did not respond to over-the-counter yeast treatments. ? ?Aptima sent and will treat based on results ?We discussed home vaginal pH testing and boric acid if this occurs in the future. ? ?HTN--pt states she has white coat HTN and PCP has completed work up.  She wil take BP at home and let us know what it is.  ? ?

## 2021-12-03 NOTE — Progress Notes (Signed)
Repeat BP 175/91  ?

## 2021-12-04 ENCOUNTER — Encounter: Payer: Self-pay | Admitting: Obstetrics & Gynecology

## 2021-12-05 ENCOUNTER — Ambulatory Visit: Payer: BC Managed Care – PPO

## 2021-12-05 DIAGNOSIS — R0683 Snoring: Secondary | ICD-10-CM | POA: Diagnosis not present

## 2021-12-05 DIAGNOSIS — G4733 Obstructive sleep apnea (adult) (pediatric): Secondary | ICD-10-CM

## 2021-12-06 LAB — CERVICOVAGINAL ANCILLARY ONLY
Bacterial Vaginitis (gardnerella): NEGATIVE
Candida Glabrata: NEGATIVE
Candida Vaginitis: NEGATIVE
Chlamydia: NEGATIVE
Comment: NEGATIVE
Comment: NEGATIVE
Comment: NEGATIVE
Comment: NEGATIVE
Comment: NEGATIVE
Comment: NORMAL
Neisseria Gonorrhea: NEGATIVE
Trichomonas: NEGATIVE

## 2021-12-07 ENCOUNTER — Ambulatory Visit: Payer: BC Managed Care – PPO | Admitting: Physician Assistant

## 2021-12-07 ENCOUNTER — Encounter: Payer: Self-pay | Admitting: Physician Assistant

## 2021-12-07 VITALS — BP 135/91 | HR 77 | Ht 61.0 in | Wt 172.0 lb

## 2021-12-07 DIAGNOSIS — G4733 Obstructive sleep apnea (adult) (pediatric): Secondary | ICD-10-CM

## 2021-12-07 DIAGNOSIS — R030 Elevated blood-pressure reading, without diagnosis of hypertension: Secondary | ICD-10-CM | POA: Diagnosis not present

## 2021-12-07 DIAGNOSIS — Z566 Other physical and mental strain related to work: Secondary | ICD-10-CM | POA: Insufficient documentation

## 2021-12-07 DIAGNOSIS — R0683 Snoring: Secondary | ICD-10-CM | POA: Diagnosis not present

## 2021-12-07 DIAGNOSIS — I10 Essential (primary) hypertension: Secondary | ICD-10-CM

## 2021-12-07 MED ORDER — HYDROCHLOROTHIAZIDE 12.5 MG PO TABS: 12.5000 mg | ORAL_TABLET | Freq: Every day | ORAL | 1 refills | Status: DC

## 2021-12-07 NOTE — Patient Instructions (Signed)

## 2021-12-07 NOTE — Progress Notes (Signed)
? ?  Acute Office Visit ? ?Subjective:  ? ?  ?Patient ID: Geovana Gebel, female    DOB: 1976-01-26, 46 y.o.   MRN: [id number] ? ?Chief Complaint  ?Patient presents with  ? Hypertension  ? ? ?HPI ?Patient is in today to discuss blood pressure. She has hx of elevated blood pressure readings for the last few years. She went to GYN and BP was 170 over 110 and told her to follow up. She is checking at home and getting readings 150-160 or 90-100. Not using CPAP for OSA. Just had recheck sleep study and trying lifestyle changes to treat. Never been on medications for BP. No CP, palpitations, headaches, SOB or swelling. She is exercise with trainer twice a week. She admits to a very stressful job.  ? ?.. ?Family History  ?Problem Relation Age of Onset  ? Heart disease Mother 22  ?     A fib  ? Fibrocystic breast disease Mother   ?     partial mastectomy  ? Hypertension Father   ? Diabetes Father   ? Diverticulosis Father   ? Depression Father   ? Crohn's disease Sister   ? Ovarian cysts Sister   ? Raynaud syndrome Sister   ? Depression Sister   ? Uterine cancer Other   ?     maternal great grandmother  ? Colon cancer Neg Hx   ? Esophageal cancer Neg Hx   ? Rectal cancer Neg Hx   ? Stomach cancer Neg Hx   ? ? ? ?ROS ?See HPI.  ? ?   ?Objective:  ?  ?BP (!) 135/91 Comment: home machine  Pulse 77   Ht '5\' 1"'$  (1.549 m)   Wt 172 lb (78 kg)   SpO2 97%   BMI 32.50 kg/m?  ?BP Readings from Last 3 Encounters:  ?12/07/21 (!) 135/91  ?12/03/21 (!) 174/96  ?10/19/21 127/79  ? ?  ? ?Physical Exam ? ? ? ?   ?Assessment & Plan:  ?..Latica was seen today for hypertension. ? ?Diagnoses and all orders for this visit: ? ?Primary hypertension ?-     hydrochlorothiazide (HYDRODIURIL) 12.5 MG tablet; Take 1 tablet (12.5 mg total) by mouth daily. ? ?Elevated blood pressure reading ?-     hydrochlorothiazide (HYDRODIURIL) 12.5 MG tablet; Take 1 tablet (12.5 mg total) by mouth daily. ? ?OSA (obstructive sleep apnea) ? ?Stress at work ? ? ?Keep  checking BP at home, resting and keep log ?Discussed DASH diet ?Discussed ways to treat stress ?Exercise at least 123mnutes a week ?Start HCTZ daily ?Discussed side effects ?HO given ?Follow up in 4 weeks with PCP. ? ? ?Return in about 4 weeks (around 01/04/2022) for F/U HTN. ? ?JIran Planas PA-C ? ? ?

## 2022-01-03 ENCOUNTER — Ambulatory Visit: Payer: BC Managed Care – PPO | Admitting: Physician Assistant

## 2022-01-04 ENCOUNTER — Ambulatory Visit: Payer: BC Managed Care – PPO | Admitting: Physician Assistant

## 2022-01-16 ENCOUNTER — Encounter: Payer: Self-pay | Admitting: Physician Assistant

## 2022-01-16 ENCOUNTER — Ambulatory Visit: Payer: BC Managed Care – PPO | Admitting: Physician Assistant

## 2022-01-16 VITALS — BP 138/88 | HR 100 | Ht 61.0 in | Wt 177.0 lb

## 2022-01-16 DIAGNOSIS — Z79899 Other long term (current) drug therapy: Secondary | ICD-10-CM

## 2022-01-16 DIAGNOSIS — I10 Essential (primary) hypertension: Secondary | ICD-10-CM | POA: Diagnosis not present

## 2022-01-16 MED ORDER — HYDROCHLOROTHIAZIDE 25 MG PO TABS: 25.0000 mg | ORAL_TABLET | Freq: Every day | ORAL | 1 refills | Status: DC

## 2022-01-16 NOTE — Progress Notes (Signed)
Home blood pressure readings: 01/16/2022 134/98 01/13/2022 127/85 01/07/2022 149/95 12/29/2021 125/89  124/91 12/28/2021 139/93  12/25/2021 132/89  126/94  141/85 12/22/2021 117/85  12/21/2021 141/90 12/20/2021 139/90 12/16/2021 124/91 12/14/2021 143/88 12/12/2021 142/92 12/11/2021 154/93

## 2022-01-16 NOTE — Progress Notes (Signed)
Established Patient Office Visit  Subjective   Patient ID: Jessica Richmond, female    DOB: 12/23/1975  Age: 46 y.o. MRN: [id number]  Chief Complaint  Patient presents with   Hypertension    HPI Patient is a 46 year old obese female with hypertension who presents to the clinic for 1 month follow-up.  Patient's blood pressures have been running fairly high and she was started on hydrochlorothiazide along with lifestyle management at last visit.  She has tolerated the HCTZ well.  She denies any cramps.  She denies any shortness of breath, palpitations, headaches or dizziness.  She has been keeping a thorough blood pressure log at home.  They have showed that blood pressures have improved over the past month.  She is ranging in the 130s over 80s.  She does feel like her stress causes her blood pressure to rise more. BP follow up   .Marland Kitchen Active Ambulatory Problems    Diagnosis Date Noted   Abdominal bloating 12/06/2013   Allergic rhinitis 12/06/2013   B12 deficiency 12/27/2013   Cystitis 07/07/2014   White coat syndrome without diagnosis of hypertension 06/13/2015   Dizziness 03/12/2016   Allergy to shrimp 09/18/2016   Weight gain 09/18/2016   Gluten intolerance 09/18/2016   Elevated liver enzymes 09/02/2019   Mixed hyperlipidemia 09/02/2019   Foot pain, bilateral 09/02/2019   Chronic RUQ pain 09/02/2019   Obstructive sleep apnea syndrome 05/30/2020   Palpitations 08/16/2021   Stress at work 12/07/2021   Primary hypertension 12/07/2021   Resolved Ambulatory Problems    Diagnosis Date Noted   Elevated BP without diagnosis of hypertension 03/28/2014   Past Medical History:  Diagnosis Date   Abnormal Pap smear of cervix    Allergy    Heart murmur    Hemangioma    History of kidney stones    Multiple food allergies    Pneumonia    Vitamin B 12 deficiency     Review of Systems  All other systems reviewed and are negative.    Objective:     BP 138/88   Pulse 100   Ht  '5\' 1"'$  (1.549 m)   Wt 177 lb (80.3 kg)   SpO2 100%   BMI 33.44 kg/m  BP Readings from Last 3 Encounters:  01/16/22 138/88  12/07/21 (!) 135/91  12/03/21 (!) 174/96   Wt Readings from Last 3 Encounters:  01/16/22 177 lb (80.3 kg)  12/07/21 172 lb (78 kg)  12/03/21 176 lb (79.8 kg)      Physical Exam Vitals reviewed.  Constitutional:      Appearance: Normal appearance. She is obese.  HENT:     Head: Normocephalic.  Cardiovascular:     Rate and Rhythm: Normal rate and regular rhythm.     Pulses: Normal pulses.  Pulmonary:     Effort: Pulmonary effort is normal.     Breath sounds: Normal breath sounds.  Musculoskeletal:     Right lower leg: No edema.     Left lower leg: No edema.  Neurological:     General: No focal deficit present.     Mental Status: She is alert and oriented to person, place, and time.  Psychiatric:        Mood and Affect: Mood normal.       Assessment & Plan:  Marland KitchenMarland KitchenAmy was seen today for hypertension.  Diagnoses and all orders for this visit:  Primary hypertension -     hydrochlorothiazide (HYDRODIURIL) 25 MG tablet; Take 1 tablet (25  mg total) by mouth daily. -     BASIC METABOLIC PANEL WITH GFR  Medication management -     hydrochlorothiazide (HYDRODIURIL) 25 MG tablet; Take 1 tablet (25 mg total) by mouth daily. -     BASIC METABOLIC PANEL WITH GFR   Blood pressure has improved today.  Patient is asymptomatic.  Bmp ordered for management.  I would like to see her closer to optimal goal of under 135/85 for her risk factors.  We discussed this and she is going to increase her hydrochlorothiazide to 25 mg today.  She will continue to log her blood pressure readings at home.  She is aware of her goal.  She is strongly encouraged to work on stress management, regular exercise, low-salt diet, weight management.  Follow up in 6 months.    Iran Planas, PA-C

## 2022-01-17 LAB — BASIC METABOLIC PANEL WITH GFR
BUN/Creatinine Ratio: 14 (calc) (ref 6–22)
BUN: 14 mg/dL (ref 7–25)
CO2: 31 mmol/L (ref 20–32)
Calcium: 10.2 mg/dL (ref 8.6–10.2)
Chloride: 103 mmol/L (ref 98–110)
Creat: 1 mg/dL — ABNORMAL HIGH (ref 0.50–0.99)
Glucose, Bld: 98 mg/dL (ref 65–99)
Potassium: 4 mmol/L (ref 3.5–5.3)
Sodium: 142 mmol/L (ref 135–146)
eGFR: 70 mL/min/{1.73_m2} (ref >=60)

## 2022-01-17 NOTE — Progress Notes (Signed)
Stable kidney function. Potassium is great. Make sure you do drink enough during the day.

## 2022-01-26 NOTE — Progress Notes (Deleted)
10/01/21- 7 yoF never smoker for sleep evaluation courtesy of Dr Luetta Nutting with concern of OSA Medical problem list includes Allergic Rhinitis, OSA, Hyperlipidemia, Multiple Food Allergies,  HST 05/15/20- AHI 8/ hr, desaturation to 81%, body weight 171 lbs Epworth score-13 Body weight today-176 lbs Covid vax-3 Moderna Flu vax-had -----Patient did HST about a year ago and did not follow through with next steps. Patient snores very loud and wakes herself up at night. Wakes up 4-5 times a night. Is not sure if she stops breathing.  Original sleep study was done because of loud snoring and nonrestorative sleep with daytime tiredness.  No history of ENT surgery.  Father has OSA, mother has A-fib.  Patient has some history of intermittent palpitation as well.  She denies lung disease but has been told she has a heart murmur. 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  01/29/22- 69 yoF never smoker followed for Primary Snoring, Excessive Daytime Sleepiness, , complicated by Allergic Rhinitis, Hyperlipidemia, Multiple Food Allergies HST 12/05/21- AHI 1.9/ hr, desaturation to 89%, body weight 176 lbs Body weight today-    ROS-see HPI   + = positive Constitutional:    weight loss, night sweats, fevers, chills, fatigue, lassitude. HEENT:    headaches, difficulty swallowing, tooth/dental problems, sore throat,       sneezing, itching, ear ache, nasal congestion, post nasal drip, snoring CV:    chest pain, orthopnea, PND, swelling in lower extremities, anasarca, dizziness, +palpitations Resp:   shortness of breath with exertion or at rest.                productive cough,   non-productive cough, coughing up of blood.              change in color of mucus.  wheezing.   Skin:    rash or lesions. GI:  No-   heartburn, indigestion, abdominal pain, nausea, vomiting, diarrhea,                 change in bowel habits, loss of appetite GU: dysuria, change in color of  urine, no urgency or frequency.   flank pain. MS:   joint pain, stiffness, decreased range of motion, back pain. Neuro-     nothing unusual Psych:  change in mood or affect.  depression or anxiety.   memory loss.  OBJ- Physical Exam General- Alert, Oriented, Affect-appropriate, Distress- none acute+ overweight Skin- rash-none, lesions- none, excoriation- none Lymphadenopathy- none Head- atraumatic            Eyes- Gross vision intact, PERRLA, conjunctivae and secretions clear            Ears- Hearing, canals-normal            Nose- Clear, no-Septal dev, mucus, polyps, erosion, perforation             Throat- Mallampati III , mucosa clear , drainage- none, tonsils- atrophic, + teeth Neck- flexible , trachea midline, no stridor , thyroid nl, carotid no bruit Chest - symmetrical excursion , unlabored           Heart/CV- RRR , no murmur , no gallop  , no rub, nl s1 s2                           - JVD- none , edema- none, stasis changes- none, varices- none           Lung- clear to P&A,  wheeze- none, cough- none , dullness-none, rub- none           Chest wall-  Abd-  Br/ Gen/ Rectal- Not done, not indicated Extrem- cyanosis- none, clubbing, none, atrophy- none, strength- nl Neuro- grossly intact to observation

## 2022-01-29 ENCOUNTER — Ambulatory Visit: Payer: BC Managed Care – PPO | Admitting: Internal Medicine

## 2022-02-06 NOTE — Progress Notes (Signed)
10/01/21- 33 yoF never smoker for sleep evaluation courtesy of Dr Luetta Nutting with concern of OSA Medical problem list includes Allergic Rhinitis, OSA, Hyperlipidemia, Multiple Food Allergies,  HST 05/15/20- AHI 8/ hr, desaturation to 81%, body weight 171 lbs Epworth score-13 Body weight today-176 lbs Covid vax-3 Moderna Flu vax-had -----Patient did HST about a year ago and did not follow through with next steps. Patient snores very loud and wakes herself up at night. Wakes up 4-5 times a night. Is not sure if she stops breathing.  Original sleep study was done because of loud snoring and nonrestorative sleep with daytime tiredness.  No history of ENT surgery.  Father has OSA, mother has A-fib.  Patient has some history of intermittent palpitation as well.  She denies lung disease but has been told she has a heart murmur. 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  02/07/22- 52 yoF never smoker followed for OSA, complicated by Allergic Rhinitis, OSA, Hyperlipidemia, Multiple Food Allergies,  HST 12/05/21- AHI 1.9/ hr, desaturation to 89%, body weight 176 lbs Body weight today 171 lbs Covid vax-none We reviewed and compared her home sleep tests.  She is quite pleased to learn of improved scores.  We discussed management strategies for primary snoring.  She does not feel that she needs to do more than that at this time.  We will be happy to see her again if needed.   ROS-see HPI   + = positive Constitutional:    weight loss, night sweats, fevers, chills, fatigue, lassitude. HEENT:    headaches, difficulty swallowing, tooth/dental problems, sore throat,       sneezing, itching, ear ache, nasal congestion, post nasal drip, snoring CV:    chest pain, orthopnea, PND, swelling in lower extremities, anasarca, dizziness, +palpitations Resp:   shortness of breath with exertion or at rest.                productive cough,   non-productive cough, coughing up of blood.               change in color of mucus.  wheezing.   Skin:    rash or lesions. GI:  No-   heartburn, indigestion, abdominal pain, nausea, vomiting, diarrhea,                 change in bowel habits, loss of appetite GU: dysuria, change in color of urine, no urgency or frequency.   flank pain. MS:   joint pain, stiffness, decreased range of motion, back pain. Neuro-     nothing unusual Psych:  change in mood or affect.  depression or anxiety.   memory loss.  OBJ- Physical Exam General- Alert, Oriented, Affect-appropriate, Distress- none acute+ overweight Skin- rash-none, lesions- none, excoriation- none Lymphadenopathy- none Head- atraumatic            Eyes- Gross vision intact, PERRLA, conjunctivae and secretions clear            Ears- Hearing, canals-normal            Nose- Clear, no-Septal dev, mucus, polyps, erosion, perforation             Throat- Mallampati III , mucosa clear , drainage- none, tonsils- atrophic,  + teeth Neck- flexible , trachea midline, no stridor , thyroid nl, carotid no bruit Chest - symmetrical excursion , unlabored           Heart/CV- RRR , no murmur , no gallop  , no rub,  nl s1 s2                           - JVD- none , edema- none, stasis changes- none, varices- none           Lung- clear to P&A, wheeze- none, cough- none , dullness-none, rub- none           Chest wall-  Abd-  Br/ Gen/ Rectal- Not done, not indicated Extrem- cyanosis- none, clubbing, none, atrophy- none, strength- nl Neuro- grossly intact to observation

## 2022-02-07 ENCOUNTER — Ambulatory Visit (INDEPENDENT_AMBULATORY_CARE_PROVIDER_SITE_OTHER): Payer: BC Managed Care – PPO | Admitting: Internal Medicine

## 2022-02-07 ENCOUNTER — Encounter: Payer: Self-pay | Admitting: Internal Medicine

## 2022-02-07 DIAGNOSIS — G4733 Obstructive sleep apnea (adult) (pediatric): Secondary | ICD-10-CM

## 2022-02-07 DIAGNOSIS — R635 Abnormal weight gain: Secondary | ICD-10-CM | POA: Diagnosis not present

## 2022-02-07 NOTE — Assessment & Plan Note (Signed)
She likely would be sensitive to weight gain but at present her condition is primary snoring without active obstructive sleep apnea.  She is not a candidate for CPAP. Plan-manage snoring by keeping weight down, sleep position off flat of back, try chinstrap or mouthpiece intended for snoring which may be available at drugstore.  We will be happy to see her again if needed.

## 2022-02-07 NOTE — Patient Instructions (Signed)
Conservative measures for snoring may include- sleep off flat of back, keep weight down. Look for a chin strap. Look in drug stores for a mouth piece specifically for snoring.  Please call if we can help

## 2022-07-18 ENCOUNTER — Ambulatory Visit: Payer: BC Managed Care – PPO | Admitting: Podiatry

## 2022-07-19 ENCOUNTER — Ambulatory Visit: Payer: BC Managed Care – PPO | Admitting: Physician Assistant

## 2022-07-22 ENCOUNTER — Encounter: Payer: Self-pay | Admitting: Podiatry

## 2022-07-22 ENCOUNTER — Ambulatory Visit: Payer: BC Managed Care – PPO | Admitting: Podiatry

## 2022-07-22 DIAGNOSIS — M21619 Bunion of unspecified foot: Secondary | ICD-10-CM | POA: Diagnosis not present

## 2022-07-22 DIAGNOSIS — M722 Plantar fascial fibromatosis: Secondary | ICD-10-CM | POA: Diagnosis not present

## 2022-07-22 NOTE — Progress Notes (Signed)
Subjective:   Patient ID: Jessica Richmond, female   DOB: 46 y.o.   MRN: [id number]   HPI Patient states she needs new orthotics she is very satisfied with the old pair holding her but she is not wearing them as well she can and she feels like they are losing some of their support   ROS      Objective:  Physical Exam  Neurovascular status intact reduced discomfort plantar fascia bilateral moderate bunion deformity bilateral that currently is not symptomatic with flatfoot deformity noted bilateral     Assessment:  Structural flatfoot deformity bilateral with history of Planter fasciitis bunion deformity     Plan:  H&P reviewed condition and recommended new orthotics with casting was done today all instructions given to patient and patient will be seen back for [place] to recheck again when the orthotics are returned

## 2022-07-23 DIAGNOSIS — F4323 Adjustment disorder with mixed anxiety and depressed mood: Secondary | ICD-10-CM | POA: Diagnosis not present

## 2022-07-26 ENCOUNTER — Other Ambulatory Visit: Payer: Self-pay | Admitting: Physician Assistant

## 2022-07-26 DIAGNOSIS — I10 Essential (primary) hypertension: Secondary | ICD-10-CM

## 2022-07-26 DIAGNOSIS — Z79899 Other long term (current) drug therapy: Secondary | ICD-10-CM

## 2022-07-29 ENCOUNTER — Encounter: Payer: Self-pay | Admitting: Family Medicine

## 2022-07-29 ENCOUNTER — Ambulatory Visit: Payer: BC Managed Care – PPO | Admitting: Family Medicine

## 2022-07-29 VITALS — BP 127/81 | HR 66 | Ht 61.0 in | Wt 175.0 lb

## 2022-07-29 DIAGNOSIS — I10 Essential (primary) hypertension: Secondary | ICD-10-CM

## 2022-07-29 NOTE — Assessment & Plan Note (Signed)
Blood pressure is well-controlled at this time.  Recommend continuation of hydrochlorothiazide at current strength.  Follow-up in about 6 months for annual exam.

## 2022-07-29 NOTE — Progress Notes (Signed)
Jessica Richmond - 46 y.o. female MRN [id number]  Date of birth: [birth year]  Subjective Chief Complaint  Patient presents with   Hypertension    HPI Jessica Richmond is a 46 year old female here today for follow-up visit.  Continues on hydrochlorothiazide at 25 mg daily.  She is tolerating this well and blood pressure is well-controlled.  She did recently quit her job and this seems to be making a difference in her stress level and blood pressure as well.  She denies symptoms related to hypertension including chest pain, shortness of breath, palpitations, headaches or vision changes.  ROS:  A comprehensive ROS was completed and negative except as noted per HPI  Allergies  Allergen Reactions   Keflex [Cephalexin] Hives   Indomethacin Nausea Only, Other (See Comments) and Nausea And Vomiting    Dizziness   Milk-Related Compounds    Shrimp [Shellfish Allergy]    Wheat Bran     Past Medical History:  Diagnosis Date   Abnormal Pap smear of cervix    ASCUS retested neg   Allergic rhinitis    Allergy    Heart murmur    Hemangioma    LIVER   History of kidney stones    CYST   Multiple food allergies    wheat, Milk, shrimp   Pneumonia    Vitamin B 12 deficiency     Past Surgical History:  Procedure Laterality Date   CHOLECYSTECTOMY N/A 10/18/2019   Procedure: LAPAROSCOPIC CHOLECYSTECTOMY;  Surgeon: Greer Pickerel, MD;  Location: [facility];  Service: General;  Laterality: N/A;   HIP ARTHROSCOPY Left    torn labrumin   WISDOM TOOTH EXTRACTION      Social History   Socioeconomic History   Marital status: Single    Spouse name: Not on file   Number of children: 0   Years of education: Not on file   Highest education level: Not on file  Occupational History   Occupation: arts admin    Employer: [employer]  Tobacco Use   Smoking status: Never   Smokeless tobacco: Never  Vaping Use   Vaping Use: Never used  Substance and Sexual Activity   Alcohol use: Yes    Comment: social   Drug  use: No   Sexual activity: Not Currently    Partners: Male    Birth control/protection: None, I.U.D.  Other Topics Concern   Not on file  Social History Narrative   Not on file   Social Determinants of Health   Financial Resource Strain: Not on file  Food Insecurity: Not on file  Transportation Needs: Not on file  Physical Activity: Not on file  Stress: Not on file  Social Connections: Not on file    Family History  Problem Relation Age of Onset   Heart disease Mother 7       A fib   Fibrocystic breast disease Mother        partial mastectomy   Hypertension Father    Diabetes Father    Diverticulosis Father    Depression Father    Crohn's disease Sister    Ovarian cysts Sister    Raynaud syndrome Sister    Depression Sister    Uterine cancer Other        maternal great grandmother   Colon cancer Neg Hx    Esophageal cancer Neg Hx    Rectal cancer Neg Hx    Stomach cancer Neg Hx     Health Maintenance  Topic Date Due  HIV Screening  Never done   Hepatitis C Screening  Never done   COVID-19 Vaccine (3 - Moderna risk series) 12/27/2019   INFLUENZA VACCINE  03/19/2022   PAP SMEAR-Modifier  05/23/2022   DTaP/Tdap/Td (2 - Td or Tdap) 12/07/2023   COLONOSCOPY (Pts 45-63yr Insurance coverage will need to be confirmed)  10/20/2026   HPV VACCINES  Aged Out     ----------------------------------------------------------------------------------------------------------------------------------------------------------------------------------------------------------------- Physical Exam BP 127/81   Pulse 66   Ht '5\' 1"'$  (1.549 m)   Wt 175 lb (79.4 kg)   SpO2 100%   BMI 33.07 kg/m   Physical Exam Constitutional:      Appearance: Normal appearance.  Neurological:     Mental Status: She is alert.      ------------------------------------------------------------------------------------------------------------------------------------------------------------------------------------------------------------------- Assessment and Plan  Primary hypertension Blood pressure is well-controlled at this time.  Recommend continuation of hydrochlorothiazide at current strength.  Follow-up in about 6 months for annual exam.   No orders of the defined types were placed in this encounter.   Return in about 6 months (around 01/28/2023) for Annual exam/fasting labs.    This visit occurred during the SARS-CoV-2 public health emergency.  Safety protocols were in place, including screening questions prior to the visit, additional usage of staff PPE, and extensive cleaning of exam room while observing appropriate contact time as indicated for disinfecting solutions.

## 2022-08-08 DIAGNOSIS — F4323 Adjustment disorder with mixed anxiety and depressed mood: Secondary | ICD-10-CM | POA: Diagnosis not present

## 2022-08-17 ENCOUNTER — Telehealth: Payer: BC Managed Care – PPO | Admitting: Nurse Practitioner

## 2022-08-17 DIAGNOSIS — U071 COVID-19: Secondary | ICD-10-CM | POA: Diagnosis not present

## 2022-08-17 MED ORDER — NIRMATRELVIR/RITONAVIR (PAXLOVID)TABLET: 3.0000 | ORAL_TABLET | Freq: Two times a day (BID) | ORAL | 0 refills | Status: AC

## 2022-08-17 MED ORDER — NIRMATRELVIR/RITONAVIR (PAXLOVID)TABLET: 3.0000 | ORAL_TABLET | Freq: Two times a day (BID) | ORAL | 0 refills | Status: DC

## 2022-08-17 NOTE — Progress Notes (Signed)
Virtual Visit Consent   Jessica Richmond, you are scheduled for a virtual visit with a [facility] provider today. Just as with appointments in the office, your consent must be obtained to participate. Your consent will be active for this visit and any virtual visit you may have with one of our providers in the next 365 days. If you have a MyChart account, a copy of this consent can be sent to you electronically.  As this is a virtual visit, video technology does not allow for your provider to perform a traditional examination. This may limit your provider's ability to fully assess your condition. If your provider identifies any concerns that need to be evaluated in person or the need to arrange testing (such as labs, EKG, etc.), we will make arrangements to do so. Although advances in technology are sophisticated, we cannot ensure that it will always work on either your end or our end. If the connection with a video visit is poor, the visit may have to be switched to a telephone visit. With either a video or telephone visit, we are not always able to ensure that we have a secure connection.  By engaging in this virtual visit, you consent to the provision of healthcare and authorize for your insurance to be billed (if applicable) for the services provided during this visit. Depending on your insurance coverage, you may receive a charge related to this service.  I need to obtain your verbal consent now. Are you willing to proceed with your visit today? Jessica Richmond has provided verbal consent on 08/17/2022 for a virtual visit (video or telephone). [name], NP  Date: 08/17/2022 4:36 PM  Virtual Visit via Video Note   I, [name], connected with  Jessica Richmond  ([id number], 1976-08-04) on 08/17/22 at  4:15 PM EST by a video-enabled telemedicine application and verified that I am speaking with the correct person using two identifiers.  Location: Patient: Virtual Visit Location Patient:  Home Provider: Virtual Visit Location Provider: Home Office   I discussed the limitations of evaluation and management by telemedicine and the availability of in person appointments. The patient expressed understanding and agreed to proceed.    History of Present Illness: Jessica Richmond is a 46 y.o. who identifies as a female who was assigned female at birth, and is being seen today for COVID-positive.  Jessica Richmond recently traveled out of town for Christmas and had a contact with a sick family member.  Tested positive for COVID last night.  Symptoms onset was 3 days ago and include dry cough, pressure behind the eyes, chills, nasal congestion.  Denies chest pain or shortness of breath.  Currently taking zyrtec, b12 vit D.  Would like antiviral for her symptoms. Problems:  Patient Active Problem List   Diagnosis Date Noted   Stress at work 12/07/2021   Primary hypertension 12/07/2021   Palpitations 08/16/2021   Obstructive sleep apnea syndrome 05/30/2020   Elevated liver enzymes 09/02/2019   Mixed hyperlipidemia 09/02/2019   Foot pain, bilateral 09/02/2019   Chronic RUQ pain 09/02/2019   Allergy to shrimp 09/18/2016   Weight gain 09/18/2016   Gluten intolerance 09/18/2016   Dizziness 03/12/2016   White coat syndrome without diagnosis of hypertension 06/13/2015   Cystitis 07/07/2014   B12 deficiency 12/27/2013   Abdominal bloating 12/06/2013   Allergic rhinitis 12/06/2013    Allergies:  Allergies  Allergen Reactions   Keflex [Cephalexin] Hives   Indomethacin Nausea Only, Other (See Comments) and Nausea  And Vomiting    Dizziness   Milk-Related Compounds    Shrimp [Shellfish Allergy]    Wheat Bran    Medications:  Current Outpatient Medications:    cetirizine (ZYRTEC) 10 MG tablet, Take 10 mg by mouth daily., Disp: , Rfl:    cyanocobalamin 1000 MCG tablet, Take 1,000 mcg by mouth daily., Disp: , Rfl:    hydrochlorothiazide (HYDRODIURIL) 25 MG tablet, Take 1 tablet (25 mg  total) by mouth daily. Needs appt, Disp: 90 tablet, Rfl: 0   levonorgestrel (MIRENA) 20 MCG/24HR IUD, 1 each by Intrauterine route once., Disp: , Rfl:    nirmatrelvir/ritonavir (PAXLOVID) 20 x 150 MG & 10 x '100MG'$  TABS, Take 3 tablets by mouth 2 (two) times daily for 5 days. (Take nirmatrelvir 150 mg two tablets twice daily for 5 days and ritonavir 100 mg one tablet twice daily for 5 days) Patient GFR is 70, Disp: 30 tablet, Rfl: 0   Vitamin D, Cholecalciferol, 25 MCG (1000 UT) TABS, Take 1,000 Units by mouth daily., Disp: , Rfl:   Observations/Objective: Patient is well-developed, well-nourished in no acute distress.  Resting comfortably  at home.  Head is normocephalic, atraumatic.  No labored breathing.  Speech is clear and coherent with logical content.  Patient is alert and oriented at baseline.    Assessment and Plan: 1. Positive self-administered antigen test for COVID-19 - nirmatrelvir/ritonavir (PAXLOVID) 20 x 150 MG & 10 x '100MG'$  TABS; Take 3 tablets by mouth 2 (two) times daily for 5 days. (Take nirmatrelvir 150 mg two tablets twice daily for 5 days and ritonavir 100 mg one tablet twice daily for 5 days) Patient GFR is 70  Dispense: 30 tablet; Refill: 0  Please keep well-hydrated and get plenty of rest. Start a saline nasal rinse to flush out your nasal passages. You can use plain Mucinex to help thin congestion. If you have a humidifier, you can use this daily as needed.    You are to wear a mask for 5 days from onset of your symptoms.  After day 5, if you have had no fever and you are feeling better with NO symptoms, you can end masking. Keep in mind you can be contagious 10 days from the onset of symptoms  After day 5 if you have a fever or are having significant symptoms, please wear your mask for full 10 days.   If you note any worsening of symptoms, any significant shortness of breath or any chest pain, please seek ER evaluation ASAP.  Please do not delay care!    If you  note any worsening of symptoms, any significant shortness of breath or any chest pain, please seek ER evaluation ASAP.  Please do not delay care!   Follow Up Instructions: I discussed the assessment and treatment plan with the patient. The patient was provided an opportunity to ask questions and all were answered. The patient agreed with the plan and demonstrated an understanding of the instructions.  A copy of instructions were sent to the patient via MyChart unless otherwise noted below.     The patient was advised to call back or seek an in-person evaluation if the symptoms worsen or if the condition fails to improve as anticipated.  Time:  I spent 12 minutes with the patient via telehealth technology discussing the above problems/concerns.    [name], NP

## 2022-08-17 NOTE — Patient Instructions (Signed)
Andree Elk, thank you for joining [name], NP for today's virtual visit.  While this provider is not your primary care provider (PCP), if your PCP is located in our provider database this encounter information will be shared with them immediately following your visit.   [facility] account gives you access to today's visit and all your visits, tests, and labs performed at [facility] " click here if you don't have a [facility] account or go to [email or web address]  Consent: (Patient) Jessica Richmond provided verbal consent for this virtual visit at the beginning of the encounter.  Current Medications:  Current Outpatient Medications:    cetirizine (ZYRTEC) 10 MG tablet, Take 10 mg by mouth daily., Disp: , Rfl:    cyanocobalamin 1000 MCG tablet, Take 1,000 mcg by mouth daily., Disp: , Rfl:    hydrochlorothiazide (HYDRODIURIL) 25 MG tablet, Take 1 tablet (25 mg total) by mouth daily. Needs appt, Disp: 90 tablet, Rfl: 0   levonorgestrel (MIRENA) 20 MCG/24HR IUD, 1 each by Intrauterine route once., Disp: , Rfl:    nirmatrelvir/ritonavir (PAXLOVID) 20 x 150 MG & 10 x '100MG'$  TABS, Take 3 tablets by mouth 2 (two) times daily for 5 days. (Take nirmatrelvir 150 mg two tablets twice daily for 5 days and ritonavir 100 mg one tablet twice daily for 5 days) Patient GFR is 70, Disp: 30 tablet, Rfl: 0   Vitamin D, Cholecalciferol, 25 MCG (1000 UT) TABS, Take 1,000 Units by mouth daily., Disp: , Rfl:    Medications ordered in this encounter:  Meds ordered this encounter  Medications   DISCONTD: nirmatrelvir/ritonavir (PAXLOVID) 20 x 150 MG & 10 x '100MG'$  TABS    Sig: Take 3 tablets by mouth 2 (two) times daily for 5 days. (Take nirmatrelvir 150 mg two tablets twice daily for 5 days and ritonavir 100 mg one tablet twice daily for 5 days) Patient GFR is 70    Dispense:  30 tablet    Refill:  0    Order Specific Question:   Supervising Provider    Answer:   Bari Mantis   nirmatrelvir/ritonavir (PAXLOVID) 20 x 150 MG & 10 x '100MG'$  TABS    Sig: Take 3 tablets by mouth 2 (two) times daily for 5 days. (Take nirmatrelvir 150 mg two tablets twice daily for 5 days and ritonavir 100 mg one tablet twice daily for 5 days) Patient GFR is 70    Dispense:  30 tablet    Refill:  0    Order Specific Question:   Supervising Provider    Answer:   Chase Picket [id number]     *If you need refills on other medications prior to your next appointment, please contact your pharmacy*  Follow-Up: Call back or seek an in-person evaluation if the symptoms worsen or if the condition fails to improve as anticipated.  [facility] [phone]  Other Instructions  Please keep well-hydrated and get plenty of rest. Start a saline nasal rinse to flush out your nasal passages. You can use plain Mucinex to help thin congestion. If you have a humidifier, you can use this daily as needed.    You are to wear a mask for 5 days from onset of your symptoms.  After day 5, if you have had no fever and you are feeling better with NO symptoms, you can end masking. Keep in mind you can be contagious 10 days from the onset of symptoms  After day 5 if you have a fever or are having significant symptoms, please wear your mask for full 10 days.   If you note any worsening of symptoms, any significant shortness of breath or any chest pain, please seek ER evaluation ASAP.  Please do not delay care!    If you note any worsening of symptoms, any significant shortness of breath or any chest pain, please seek ER evaluation ASAP.  Please do not delay care!    If you have been instructed to have an in-person evaluation today at a local Urgent Care facility, please use the link below. It will take you to a list of all of our available [facility], including address, phone number and hours of operation. Please do not delay care.  [facility]  If  you or a family member do not have a primary care provider, use the link below to schedule a visit and establish care. When you choose a [facility] primary care physician or advanced practice provider, you gain a long-term partner in health. Find a Primary Care Provider  Learn more about [facility] in-office and virtual care options: [facility]

## 2022-08-22 ENCOUNTER — Ambulatory Visit: Payer: BC Managed Care – PPO | Admitting: Obstetrics and Gynecology

## 2022-08-22 DIAGNOSIS — F4323 Adjustment disorder with mixed anxiety and depressed mood: Secondary | ICD-10-CM | POA: Diagnosis not present

## 2022-08-28 NOTE — Progress Notes (Unsigned)
   ANNUAL EXAM Patient name: Jessica Richmond MRN [id number]  Date of birth: February 13, 1976 Chief Complaint:   No chief complaint on file.  History of Present Illness:   Jessica Richmond is a 47 y.o. G0P0000 female being seen today for a routine annual exam.   Current complaints: ***  No LMP recorded. (Menstrual status: IUD).  Current birth control: IUD  Last pap: 05/2019. Results were: NILM w/ HRHPV negative. H/O abnormal pap: {yes/yes***/no:23866} Last MXR: 09/2021  Health Maintenance Due  Topic Date Due   HIV Screening  Never done   Hepatitis C Screening  Never done   COVID-19 Vaccine (3 - Moderna risk series) 12/27/2019   PAP SMEAR-Modifier  05/23/2022    Review of Systems:   Pertinent items are noted in HPI Denies any headaches, blurred vision, fatigue, shortness of breath, chest pain, abdominal pain, abnormal vaginal discharge/itching/odor/irritation, problems with periods, bowel movements, urination, or intercourse unless otherwise stated above. *** Pertinent History Reviewed:  Reviewed past medical,surgical, social and family history.  Reviewed problem list, medications and allergies. Physical Assessment:  There were no vitals filed for this visit.There is no height or weight on file to calculate BMI.   Physical Examination:  General appearance - well appearing, and in no distress Mental status - alert, oriented to person, place, and time Psych:  She has a normal mood and affect Skin - warm and dry, normal color, no suspicious lesions noted Chest - effort normal Heart - normal rate  Breasts - breasts appear normal, no suspicious masses, no skin or nipple changes or axillary nodes Abdomen - soft, nontender, nondistended, no masses or organomegaly Pelvic -  VULVA: normal appearing vulva with no masses, tenderness or lesions  VAGINA: normal appearing vagina with normal color and discharge, no lesions  CERVIX: normal appearing cervix without discharge or lesions, no CMT UTERUS:  uterus is felt to be normal size, shape, consistency and nontender  ADNEXA: No adnexal masses or tenderness noted. Extremities:  No swelling or varicosities noted  Chaperone present for exam  No results found for this or any previous visit (from the past 24 hour(s)).  Assessment & Plan:  Diagnoses and all orders for this visit:  Encounter for annual routine gynecological examination  - Cervical cancer screening: Discussed guidelines. Pap with HPV done - Gardasil: {Blank single:19197::"***","has not yet had. Will provide information","completed","has not yet had. Counseling provided and she declines","Has not yet had. Counseling provided and pt accepts"} - STD Testing: {Blank single:19197::"accepts","declines","not indicated"} - Birth Control:  IUD - placed 2017 - Breast Health: Encouraged self breast awareness/SBE. Teaching provided. Discussed limits of clinical breast exam for detecting breast cancer. Rx given for MXR - F/U 12 months and prn     No orders of the defined types were placed in this encounter.   Meds: No orders of the defined types were placed in this encounter.   Follow-up: No follow-ups on file.  Radene Gunning, MD 08/28/2022 2:06 PM

## 2022-08-29 ENCOUNTER — Ambulatory Visit (INDEPENDENT_AMBULATORY_CARE_PROVIDER_SITE_OTHER): Payer: BC Managed Care – PPO | Admitting: Obstetrics and Gynecology

## 2022-08-29 ENCOUNTER — Other Ambulatory Visit (HOSPITAL_COMMUNITY)
Admission: RE | Admit: 2022-08-29 | Discharge: 2022-08-29 | Disposition: A | Discharge status: Home / Self Care | Payer: BC Managed Care – PPO | Source: Ambulatory Visit | Attending: Obstetrics and Gynecology | Admitting: Obstetrics and Gynecology

## 2022-08-29 ENCOUNTER — Encounter: Payer: Self-pay | Admitting: Obstetrics and Gynecology

## 2022-08-29 VITALS — BP 152/91 | HR 96 | Resp 16 | Ht 61.0 in | Wt 177.0 lb

## 2022-08-29 DIAGNOSIS — Z01419 Encounter for gynecological examination (general) (routine) without abnormal findings: Secondary | ICD-10-CM | POA: Diagnosis not present

## 2022-09-02 LAB — CYTOLOGY - PAP
Comment: NEGATIVE
Diagnosis: UNDETERMINED — AB
High risk HPV: NEGATIVE

## 2022-10-24 ENCOUNTER — Other Ambulatory Visit: Payer: Self-pay

## 2022-10-24 DIAGNOSIS — Z79899 Other long term (current) drug therapy: Secondary | ICD-10-CM

## 2022-10-24 DIAGNOSIS — I10 Essential (primary) hypertension: Secondary | ICD-10-CM

## 2022-10-24 MED ORDER — HYDROCHLOROTHIAZIDE 25 MG PO TABS: 25.0000 mg | ORAL_TABLET | Freq: Every day | ORAL | 0 refills | Status: DC

## 2022-10-28 ENCOUNTER — Ambulatory Visit: Payer: BC Managed Care – PPO

## 2022-12-09 ENCOUNTER — Ambulatory Visit
Admission: RE | Admit: 2022-12-09 | Discharge: 2022-12-09 | Disposition: A | Discharge status: Home / Self Care | Payer: BC Managed Care – PPO | Source: Ambulatory Visit | Attending: Obstetrics and Gynecology | Admitting: Obstetrics and Gynecology

## 2022-12-09 DIAGNOSIS — Z1231 Encounter for screening mammogram for malignant neoplasm of breast: Secondary | ICD-10-CM | POA: Diagnosis not present

## 2022-12-09 DIAGNOSIS — Z01419 Encounter for gynecological examination (general) (routine) without abnormal findings: Secondary | ICD-10-CM

## 2023-01-23 ENCOUNTER — Other Ambulatory Visit: Payer: Self-pay

## 2023-01-23 DIAGNOSIS — I10 Essential (primary) hypertension: Secondary | ICD-10-CM

## 2023-01-23 DIAGNOSIS — Z79899 Other long term (current) drug therapy: Secondary | ICD-10-CM

## 2023-01-23 MED ORDER — HYDROCHLOROTHIAZIDE 25 MG PO TABS: 25.0000 mg | ORAL_TABLET | Freq: Every day | ORAL | 0 refills | Status: DC

## 2023-02-04 ENCOUNTER — Ambulatory Visit (INDEPENDENT_AMBULATORY_CARE_PROVIDER_SITE_OTHER): Payer: BC Managed Care – PPO | Admitting: Family Medicine

## 2023-02-04 ENCOUNTER — Encounter: Payer: Self-pay | Admitting: Family Medicine

## 2023-02-04 VITALS — BP 123/77 | HR 70 | Ht 61.0 in | Wt 174.0 lb

## 2023-02-04 DIAGNOSIS — E538 Deficiency of other specified B group vitamins: Secondary | ICD-10-CM | POA: Diagnosis not present

## 2023-02-04 DIAGNOSIS — Z0000 Encounter for general adult medical examination without abnormal findings: Secondary | ICD-10-CM

## 2023-02-04 DIAGNOSIS — I10 Essential (primary) hypertension: Secondary | ICD-10-CM | POA: Diagnosis not present

## 2023-02-04 DIAGNOSIS — E782 Mixed hyperlipidemia: Secondary | ICD-10-CM

## 2023-02-04 NOTE — Assessment & Plan Note (Signed)
Well adult Orders Placed This Encounter  Procedures   COMPLETE METABOLIC PANEL WITH GFR   CBC with Differential   Lipid Panel w/reflex Direct LDL   TSH   B12  Screenings: per lab orders Immunization: UTD Anticipatory guidance/Risk factor reduction:  Encouraged continued dietary/lifestyle changes.  Additional recommendations per AVS.

## 2023-02-04 NOTE — Patient Instructions (Signed)
Preventive Care 40-49 Years Old, Female Preventive care refers to lifestyle choices and visits with your health care provider that can promote health and wellness. Preventive care visits are also called wellness exams. What can I expect for my preventive care visit? Counseling Your health care provider may ask you questions about your: Medical history, including: Past medical problems. Family medical history. Pregnancy history. Current health, including: Menstrual cycle. Method of birth control. Emotional well-being. Home life and relationship well-being. Sexual activity and sexual health. Lifestyle, including: Alcohol, nicotine or tobacco, and drug use. Access to firearms. Diet, exercise, and sleep habits. Work and work environment. Sunscreen use. Safety issues such as seatbelt and bike helmet use. Physical exam Your health care provider will check your: Height and weight. These may be used to calculate your BMI (body mass index). BMI is a measurement that tells if you are at a healthy weight. Waist circumference. This measures the distance around your waistline. This measurement also tells if you are at a healthy weight and may help predict your risk of certain diseases, such as type 2 diabetes and high blood pressure. Heart rate and blood pressure. Body temperature. Skin for abnormal spots. What immunizations do I need?  Vaccines are usually given at various ages, according to a schedule. Your health care provider will recommend vaccines for you based on your age, medical history, and lifestyle or other factors, such as travel or where you work. What tests do I need? Screening Your health care provider may recommend screening tests for certain conditions. This may include: Lipid and cholesterol levels. Diabetes screening. This is done by checking your blood sugar (glucose) after you have not eaten for a while (fasting). Pelvic exam and Pap test. Hepatitis B test. Hepatitis C  test. HIV (human immunodeficiency virus) test. STI (sexually transmitted infection) testing, if you are at risk. Lung cancer screening. Colorectal cancer screening. Mammogram. Talk with your health care provider about when you should start having regular mammograms. This may depend on whether you have a family history of breast cancer. BRCA-related cancer screening. This may be done if you have a family history of breast, ovarian, tubal, or peritoneal cancers. Bone density scan. This is done to screen for osteoporosis. Talk with your health care provider about your test results, treatment options, and if necessary, the need for more tests. Follow these instructions at home: Eating and drinking  Eat a diet that includes fresh fruits and vegetables, whole grains, lean protein, and low-fat dairy products. Take vitamin and mineral supplements as recommended by your health care provider. Do not drink alcohol if: Your health care provider tells you not to drink. You are pregnant, may be pregnant, or are planning to become pregnant. If you drink alcohol: Limit how much you have to 0-1 drink a day. Know how much alcohol is in your drink. In the [place] one drink equals one 12 oz bottle of beer (355 mL), one 5 oz glass of wine (148 mL), or one 1 oz glass of hard liquor (44 mL). Lifestyle Brush your teeth every morning and night with fluoride toothpaste. Floss one time each day. Exercise for at least 30 minutes 5 or more days each week. Do not use any products that contain nicotine or tobacco. These products include cigarettes, chewing tobacco, and vaping devices, such as e-cigarettes. If you need help quitting, ask your health care provider. Do not use drugs. If you are sexually active, practice safe sex. Use a condom or other form of protection to   prevent STIs. If you do not wish to become pregnant, use a form of birth control. If you plan to become pregnant, see your health care provider for a  prepregnancy visit. Take aspirin only as told by your health care provider. Make sure that you understand how much to take and what form to take. Work with your health care provider to find out whether it is safe and beneficial for you to take aspirin daily. Find healthy ways to manage stress, such as: Meditation, yoga, or listening to music. Journaling. Talking to a trusted person. Spending time with friends and family. Minimize exposure to UV radiation to reduce your risk of skin cancer. Safety Always wear your seat belt while driving or riding in a vehicle. Do not drive: If you have been drinking alcohol. Do not ride with someone who has been drinking. When you are tired or distracted. While texting. If you have been using any mind-altering substances or drugs. Wear a helmet and other protective equipment during sports activities. If you have firearms in your house, make sure you follow all gun safety procedures. Seek help if you have been physically or sexually abused. What's next? Visit your health care provider once a year for an annual wellness visit. Ask your health care provider how often you should have your eyes and teeth checked. Stay up to date on all vaccines. This information is not intended to replace advice given to you by your health care provider. Make sure you discuss any questions you have with your health care provider. Document Revised: 01/31/2021 Document Reviewed: 01/31/2021 Elsevier Patient Education  2024 [employer]  

## 2023-02-04 NOTE — Progress Notes (Signed)
Jessica Richmond - 48 y.o. female MRN [id number]  Date of birth: [birth year]  Subjective Chief Complaint  Patient presents with   Annual Exam    HPI Jessica Richmond is a 47 y.o. female here today for follow up visit.    She reports that she is doing well.   BP remains well controlled.   She is working with [employer].  Weight is down about 3lbs since last visit.  She is also working with a Systems analyst to work on her general fitness.   She is a non-smoker.  Occasional EtOH use.   She is up to date on recommended screenings.  Has GYN.   Review of Systems  Constitutional:  Negative for chills, fever, malaise/fatigue and weight loss.  HENT:  Negative for congestion, ear pain and sore throat.   Eyes:  Negative for blurred vision, double vision and pain.  Respiratory:  Negative for cough and shortness of breath.   Cardiovascular:  Negative for chest pain and palpitations.  Gastrointestinal:  Negative for abdominal pain, blood in stool, constipation, heartburn and nausea.  Genitourinary:  Negative for dysuria and urgency.  Musculoskeletal:  Negative for joint pain and myalgias.  Neurological:  Negative for dizziness and headaches.  Endo/Heme/Allergies:  Does not bruise/bleed easily.  Psychiatric/Behavioral:  Negative for depression. The patient is not nervous/anxious and does not have insomnia.     Allergies  Allergen Reactions   Keflex [Cephalexin] Hives   Indomethacin Nausea Only, Other (See Comments) and Nausea And Vomiting    Dizziness   Milk-Related Compounds    Shrimp [Shellfish Allergy]    Wheat     Past Medical History:  Diagnosis Date   Abnormal Pap smear of cervix    ASCUS retested neg   Allergic rhinitis    Allergy    Heart murmur    Hemangioma    LIVER   History of kidney stones    CYST   Multiple food allergies    wheat, Milk, shrimp   Pneumonia    Vitamin B 12 deficiency     Past Surgical History:  Procedure Laterality Date   CHOLECYSTECTOMY  N/A 10/18/2019   Procedure: LAPAROSCOPIC CHOLECYSTECTOMY;  Surgeon: Gaynelle Adu, MD;  Location: [facility];  Service: General;  Laterality: N/A;   HIP ARTHROSCOPY Left    torn labrumin   WISDOM TOOTH EXTRACTION      Social History   Socioeconomic History   Marital status: Single    Spouse name: Not on file   Number of children: 0   Years of education: Not on file   Highest education level: Not on file  Occupational History   Occupation: arts admin    Employer: [employer]  Tobacco Use   Smoking status: Never   Smokeless tobacco: Never  Vaping Use   Vaping Use: Never used  Substance and Sexual Activity   Alcohol use: Yes    Comment: social   Drug use: No   Sexual activity: Not Currently    Partners: Male    Birth control/protection: None, I.U.D.  Other Topics Concern   Not on file  Social History Narrative   Not on file   Social Determinants of Health   Financial Resource Strain: Not on file  Food Insecurity: Not on file  Transportation Needs: Not on file  Physical Activity: Not on file  Stress: Not on file  Social Connections: Not on file    Family History  Problem Relation Age of Onset   Heart disease  Mother 33       A fib   Fibrocystic breast disease Mother        partial mastectomy   Hypertension Father    Diabetes Father    Diverticulosis Father    Depression Father    Crohn's disease Sister    Ovarian cysts Sister    Raynaud syndrome Sister    Depression Sister    Uterine cancer Other        maternal great grandmother   Colon cancer Neg Hx    Esophageal cancer Neg Hx    Rectal cancer Neg Hx    Stomach cancer Neg Hx     Health Maintenance  Topic Date Due   COVID-19 Vaccine (3 - Moderna risk series) 07/23/2023 (Originally 12/27/2019)   Hepatitis C Screening  02/04/2024 (Originally 02/27/1992)   HIV Screening  02/04/2024 (Originally 02/26/1989)   INFLUENZA VACCINE  03/20/2023   DTaP/Tdap/Td (2 - Td or Tdap) 12/07/2023   PAP SMEAR-Modifier   08/29/2025   Colonoscopy  10/20/2026   HPV VACCINES  Aged Out     ----------------------------------------------------------------------------------------------------------------------------------------------------------------------------------------------------------------- Physical Exam BP 123/77 (BP Location: Left Arm, Patient Position: Sitting, Cuff Size: Normal)   Pulse 70   Ht 5\' 1"  (1.549 m)   Wt 174 lb (78.9 kg)   SpO2 98%   BMI 32.88 kg/m   Physical Exam Constitutional:      General: She is not in acute distress. HENT:     Head: Normocephalic and atraumatic.     Right Ear: Tympanic membrane and ear canal normal.     Left Ear: Tympanic membrane and ear canal normal.     Nose: Nose normal.  Eyes:     General: No scleral icterus.    Conjunctiva/sclera: Conjunctivae normal.  Neck:     Thyroid: No thyromegaly.  Cardiovascular:     Rate and Rhythm: Normal rate and regular rhythm.     Heart sounds: Normal heart sounds.  Pulmonary:     Effort: Pulmonary effort is normal.     Breath sounds: Normal breath sounds.  Abdominal:     General: Bowel sounds are normal. There is no distension.     Palpations: Abdomen is soft.     Tenderness: There is no abdominal tenderness. There is no guarding.  Musculoskeletal:        General: Normal range of motion.     Cervical back: Normal range of motion and neck supple.  Lymphadenopathy:     Cervical: No cervical adenopathy.  Skin:    General: Skin is warm and dry.     Findings: No rash.  Neurological:     General: No focal deficit present.     Mental Status: She is alert and oriented to person, place, and time.     Cranial Nerves: No cranial nerve deficit.     Coordination: Coordination normal.  Psychiatric:        Mood and Affect: Mood normal.        Behavior: Behavior normal.      ------------------------------------------------------------------------------------------------------------------------------------------------------------------------------------------------------------------- Assessment and Plan  Well adult exam Well adult Orders Placed This Encounter  Procedures   COMPLETE METABOLIC PANEL WITH GFR   CBC with Differential   Lipid Panel w/reflex Direct LDL   TSH   B12  Screenings: per lab orders Immunization: UTD Anticipatory guidance/Risk factor reduction:  Encouraged continued dietary/lifestyle changes.  Additional recommendations per AVS.    No orders of the defined types were placed in this encounter.   Return in about  6 months (around 08/06/2023) for HTN.    This visit occurred during the SARS-CoV-2 public health emergency.  Safety protocols were in place, including screening questions prior to the visit, additional usage of staff PPE, and extensive cleaning of exam room while observing appropriate contact time as indicated for disinfecting solutions.

## 2023-02-05 LAB — COMPLETE METABOLIC PANEL WITH GFR
AG Ratio: 1.7 (calc) (ref 1.0–2.5)
ALT: 14 U/L (ref 6–29)
AST: 16 U/L (ref 10–35)
Albumin: 4.4 g/dL (ref 3.6–5.1)
Alkaline phosphatase (APISO): 65 U/L (ref 31–125)
BUN: 17 mg/dL (ref 7–25)
CO2: 29 mmol/L (ref 20–32)
Calcium: 9.8 mg/dL (ref 8.6–10.2)
Chloride: 102 mmol/L (ref 98–110)
Creat: 0.92 mg/dL (ref 0.50–0.99)
Globulin: 2.6 g/dL (calc) (ref 1.9–3.7)
Glucose, Bld: 104 mg/dL — ABNORMAL HIGH (ref 65–99)
Potassium: 3.9 mmol/L (ref 3.5–5.3)
Sodium: 139 mmol/L (ref 135–146)
Total Bilirubin: 1.1 mg/dL (ref 0.2–1.2)
Total Protein: 7 g/dL (ref 6.1–8.1)
eGFR: 77 mL/min/{1.73_m2} (ref >=60)

## 2023-02-05 LAB — CBC WITH DIFFERENTIAL/PLATELET
Absolute Monocytes: 360 cells/uL (ref 200–950)
Basophils Absolute: 37 cells/uL (ref 0–200)
Basophils Relative: 0.6 %
Eosinophils Absolute: 50 cells/uL (ref 15–500)
Eosinophils Relative: 0.8 %
HCT: 42.6 % (ref 35.0–45.0)
Hemoglobin: 14.2 g/dL (ref 11.7–15.5)
Lymphs Abs: 2542 cells/uL (ref 850–3900)
MCH: 30 pg (ref 27.0–33.0)
MCHC: 33.3 g/dL (ref 32.0–36.0)
MCV: 89.9 fL (ref 80.0–100.0)
MPV: 10.6 fL (ref 7.5–12.5)
Monocytes Relative: 5.8 %
Neutro Abs: 3212 cells/uL (ref 1500–7800)
Neutrophils Relative %: 51.8 %
Platelets: 341 10*3/uL (ref 140–400)
RBC: 4.74 10*6/uL (ref 3.80–5.10)
RDW: 11.7 % (ref 11.0–15.0)
Total Lymphocyte: 41 %
WBC: 6.2 10*3/uL (ref 3.8–10.8)

## 2023-02-05 LAB — LIPID PANEL W/REFLEX DIRECT LDL
Cholesterol: 234 mg/dL — ABNORMAL HIGH (ref <200)
HDL: 48 mg/dL — ABNORMAL LOW (ref >=50)
LDL Cholesterol (Calc): 153 mg/dL (calc) — ABNORMAL HIGH
Non-HDL Cholesterol (Calc): 186 mg/dL (calc) — ABNORMAL HIGH (ref <130)
Total CHOL/HDL Ratio: 4.9 (calc) (ref <5.0)
Triglycerides: 189 mg/dL — ABNORMAL HIGH (ref <150)

## 2023-02-05 LAB — VITAMIN B12: Vitamin B-12: 1258 pg/mL — ABNORMAL HIGH (ref 200–1100)

## 2023-02-05 LAB — TSH: TSH: 3.07 mIU/L

## 2023-02-10 DIAGNOSIS — H04123 Dry eye syndrome of bilateral lacrimal glands: Secondary | ICD-10-CM | POA: Diagnosis not present

## 2023-02-10 DIAGNOSIS — H52203 Unspecified astigmatism, bilateral: Secondary | ICD-10-CM | POA: Diagnosis not present

## 2023-04-22 ENCOUNTER — Other Ambulatory Visit: Payer: Self-pay | Admitting: Family Medicine

## 2023-04-22 DIAGNOSIS — Z79899 Other long term (current) drug therapy: Secondary | ICD-10-CM

## 2023-04-22 DIAGNOSIS — I10 Essential (primary) hypertension: Secondary | ICD-10-CM

## 2023-08-05 ENCOUNTER — Telehealth: Payer: Self-pay | Admitting: *Deleted

## 2023-08-05 NOTE — Telephone Encounter (Signed)
Left patient a message to call and schedule annual. 

## 2023-08-06 ENCOUNTER — Encounter: Payer: Self-pay | Admitting: Family Medicine

## 2023-08-06 ENCOUNTER — Ambulatory Visit: Payer: BC Managed Care – PPO | Admitting: Family Medicine

## 2023-08-06 VITALS — BP 112/73 | HR 61 | Ht 61.0 in | Wt 180.0 lb

## 2023-08-06 DIAGNOSIS — Z23 Encounter for immunization: Secondary | ICD-10-CM

## 2023-08-06 DIAGNOSIS — E782 Mixed hyperlipidemia: Secondary | ICD-10-CM | POA: Diagnosis not present

## 2023-08-06 DIAGNOSIS — R635 Abnormal weight gain: Secondary | ICD-10-CM

## 2023-08-06 DIAGNOSIS — R748 Abnormal levels of other serum enzymes: Secondary | ICD-10-CM

## 2023-08-06 DIAGNOSIS — I10 Essential (primary) hypertension: Secondary | ICD-10-CM | POA: Diagnosis not present

## 2023-08-06 NOTE — Patient Instructions (Signed)
[facility] at [facility] [address] [place],  [place]  [address]  Main: [phone]

## 2023-08-06 NOTE — Assessment & Plan Note (Signed)
Continue with increased activity.  Given information re: healthy weight and wellness.

## 2023-08-06 NOTE — Assessment & Plan Note (Signed)
Blood pressure is well-controlled at this time.  Recommend continuation of hydrochlorothiazide at current strength.  Follow-up in about 6 months for annual exam.

## 2023-08-06 NOTE — Progress Notes (Signed)
Jessica Richmond - 47 y.o. female MRN [id number]  Date of birth: 1975-09-05  Subjective Chief Complaint  Patient presents with   Hypertension    HPI Jessica Richmond is a 47 y.o. female here today for follow up visit.    She reports that she is doing well.  Remains on hydrochlorothiazide for management of HTN.  BP is well controlled with this at current strength.  She denies side effects from this at current strength.    Cholesterol elevated on previous labs.  Would like ot recheck this today.  She has been working on weight loss efforts but has found this difficult to have any lasting weight loss.  She does stay pretty active. Finds herself craving sweets fairly often.    ROS:  A comprehensive ROS was completed and negative except as noted per HPI  Allergies  Allergen Reactions   Keflex [Cephalexin] Hives   Indomethacin Nausea Only, Other (See Comments) and Nausea And Vomiting    Dizziness   Milk-Related Compounds    Shrimp [Shellfish Allergy]    Wheat     Past Medical History:  Diagnosis Date   Abnormal Pap smear of cervix    ASCUS retested neg   Allergic rhinitis    Allergy    Heart murmur    Hemangioma    LIVER   History of kidney stones    CYST   Multiple food allergies    wheat, Milk, shrimp   Pneumonia    Vitamin B 12 deficiency     Past Surgical History:  Procedure Laterality Date   CHOLECYSTECTOMY N/A 10/18/2019   Procedure: LAPAROSCOPIC CHOLECYSTECTOMY;  Surgeon: Gaynelle Adu, MD;  Location: [facility];  Service: General;  Laterality: N/A;   HIP ARTHROSCOPY Left    torn labrumin   WISDOM TOOTH EXTRACTION      Social History   Socioeconomic History   Marital status: Single    Spouse name: Not on file   Number of children: 0   Years of education: Not on file   Highest education level: Master's degree (e.g., MA, MS, MEng, MEd, MSW, MBA)  Occupational History   Occupation: arts Chartered certified accountant: [employer]  Tobacco Use   Smoking status: Never    Smokeless tobacco: Never  Vaping Use   Vaping status: Never Used  Substance and Sexual Activity   Alcohol use: Yes    Comment: social   Drug use: No   Sexual activity: Not Currently    Partners: Male    Birth control/protection: None, I.U.D.  Other Topics Concern   Not on file  Social History Narrative   Not on file   Social Drivers of Health   Financial Resource Strain: Low Risk  (08/05/2023)   Overall Financial Resource Strain (CARDIA)    Difficulty of Paying Living Expenses: Not very hard  Food Insecurity: No Food Insecurity (08/05/2023)   Hunger Vital Sign    Worried About Running Out of Food in the Last Year: Never true    Ran Out of Food in the Last Year: Never true  Transportation Needs: No Transportation Needs (08/05/2023)   PRAPARE - Administrator, Civil Service (Medical): No    Lack of Transportation (Non-Medical): No  Physical Activity: Sufficiently Active (08/05/2023)   Exercise Vital Sign    Days of Exercise per Week: 3 days    Minutes of Exercise per Session: 50 min  Stress: No Stress Concern Present (08/05/2023)   [employer] of Occupational Health -  Occupational Stress Questionnaire    Feeling of Stress : Not at all  Social Connections: Moderately Isolated (08/05/2023)   Social Connection and Isolation Panel [NHANES]    Frequency of Communication with Friends and Family: Twice a week    Frequency of Social Gatherings with Friends and Family: Once a week    Attends Religious Services: Never    Database administrator or Organizations: Yes    Attends Engineer, structural: More than 4 times per year    Marital Status: Never married    Family History  Problem Relation Age of Onset   Heart disease Mother 87       A fib   Fibrocystic breast disease Mother        partial mastectomy   Hypertension Father    Diabetes Father    Diverticulosis Father    Depression Father    Crohn's disease Sister    Ovarian cysts Sister     Raynaud syndrome Sister    Depression Sister    Uterine cancer Other        maternal great grandmother   Colon cancer Neg Hx    Esophageal cancer Neg Hx    Rectal cancer Neg Hx    Stomach cancer Neg Hx     Health Maintenance  Topic Date Due   Hepatitis C Screening  02/04/2024 (Originally 02/27/1992)   HIV Screening  02/04/2024 (Originally 02/26/1989)   COVID-19 Vaccine (4 - 2024-25 season) 10/01/2023   DTaP/Tdap/Td (2 - Td or Tdap) 12/07/2023   Colonoscopy  10/20/2026   Cervical Cancer Screening (HPV/Pap Cotest)  08/30/2027   INFLUENZA VACCINE  Completed   HPV VACCINES  Aged Out     ----------------------------------------------------------------------------------------------------------------------------------------------------------------------------------------------------------------- Physical Exam BP 112/73 (BP Location: Left Arm, Patient Position: Sitting, Cuff Size: Normal)   Pulse 61   Ht 5\' 1"  (1.549 m)   Wt 180 lb (81.6 kg)   SpO2 99%   BMI 34.01 kg/m   Physical Exam Constitutional:      Appearance: Normal appearance.  Eyes:     General: No scleral icterus. Cardiovascular:     Rate and Rhythm: Normal rate and regular rhythm.  Pulmonary:     Effort: Pulmonary effort is normal.     Breath sounds: Normal breath sounds.  Musculoskeletal:     Cervical back: Neck supple.  Neurological:     Mental Status: She is alert.  Psychiatric:        Mood and Affect: Mood normal.        Behavior: Behavior normal.     ------------------------------------------------------------------------------------------------------------------------------------------------------------------------------------------------------------------- Assessment and Plan  Weight gain Continue with increased activity.  Given information re: healthy weight and wellness.   Primary hypertension Blood pressure is well-controlled at this time.  Recommend continuation of hydrochlorothiazide at  current strength.  Follow-up in about 6 months for annual exam.  Mixed hyperlipidemia Update lipid panel.    No orders of the defined types were placed in this encounter.   Return in about 6 months (around 02/04/2024) for Annual Exam, Hypertension, fasting labs.    This visit occurred during the SARS-CoV-2 public health emergency.  Safety protocols were in place, including screening questions prior to the visit, additional usage of staff PPE, and extensive cleaning of exam room while observing appropriate contact time as indicated for disinfecting solutions.

## 2023-08-06 NOTE — Assessment & Plan Note (Signed)
Update lipid panel.  

## 2023-08-07 LAB — LIPID PANEL WITH LDL/HDL RATIO
Cholesterol, Total: 242 mg/dL — ABNORMAL HIGH (ref 100–199)
HDL: 46 mg/dL (ref >39)
LDL Chol Calc (NIH): 168 mg/dL — ABNORMAL HIGH (ref 0–99)
LDL/HDL Ratio: 3.7 {ratio} — ABNORMAL HIGH (ref 0.0–3.2)
Triglycerides: 153 mg/dL — ABNORMAL HIGH (ref 0–149)
VLDL Cholesterol Cal: 28 mg/dL (ref 5–40)

## 2023-08-07 LAB — CMP14+EGFR
ALT: 25 [IU]/L (ref 0–32)
AST: 27 [IU]/L (ref 0–40)
Albumin: 4.5 g/dL (ref 3.9–4.9)
Alkaline Phosphatase: 82 [IU]/L (ref 44–121)
BUN/Creatinine Ratio: 11 (ref 9–23)
BUN: 11 mg/dL (ref 6–24)
Bilirubin Total: 0.6 mg/dL (ref 0.0–1.2)
CO2: 23 mmol/L (ref 20–29)
Calcium: 9.5 mg/dL (ref 8.7–10.2)
Chloride: 99 mmol/L (ref 96–106)
Creatinine, Ser: 0.99 mg/dL (ref 0.57–1.00)
Globulin, Total: 2.8 g/dL (ref 1.5–4.5)
Glucose: 110 mg/dL — ABNORMAL HIGH (ref 70–99)
Potassium: 3.8 mmol/L (ref 3.5–5.2)
Sodium: 142 mmol/L (ref 134–144)
Total Protein: 7.3 g/dL (ref 6.0–8.5)
eGFR: 70 mL/min/{1.73_m2} (ref >59)

## 2023-08-19 DIAGNOSIS — Z0289 Encounter for other administrative examinations: Secondary | ICD-10-CM

## 2023-08-21 ENCOUNTER — Ambulatory Visit: Payer: BC Managed Care – PPO | Admitting: Bariatrics

## 2023-08-21 ENCOUNTER — Encounter: Payer: Self-pay | Admitting: Bariatrics

## 2023-08-21 VITALS — BP 129/78 | HR 95 | Temp 98.1°F | Ht 61.5 in | Wt 177.0 lb

## 2023-08-21 DIAGNOSIS — E669 Obesity, unspecified: Secondary | ICD-10-CM

## 2023-08-21 DIAGNOSIS — E785 Hyperlipidemia, unspecified: Secondary | ICD-10-CM | POA: Diagnosis not present

## 2023-08-21 DIAGNOSIS — I10 Essential (primary) hypertension: Secondary | ICD-10-CM

## 2023-08-21 DIAGNOSIS — Z6832 Body mass index (BMI) 32.0-32.9, adult: Secondary | ICD-10-CM

## 2023-08-21 DIAGNOSIS — E6609 Other obesity due to excess calories: Secondary | ICD-10-CM

## 2023-08-21 DIAGNOSIS — E782 Mixed hyperlipidemia: Secondary | ICD-10-CM

## 2023-08-21 NOTE — Progress Notes (Signed)
 Office: [phone]  /  Fax: [phone]   Initial Visit  Jessica Richmond was seen in clinic today to evaluate for obesity. She is interested in losing weight to improve overall health and reduce the risk of weight related complications. She presents today to review program treatment options, initial physical assessment, and evaluation.     She was referred by: PCP  When asked what else they would like to accomplish? She states: Adopt healthier eating patterns, Improve energy levels and physical activity, and Improve quality of life  When asked how has your weight affected you? She states: Other: Wants to stay healthy.   Some associated conditions: Hypertension, Hyperlipidemia, and Other: mild fatty liver.   Contributing factors: Family history of obesity  Weight promoting medications identified: Contraceptives or hormonal therapy  Current nutrition plan: Other: Noom  Current level of physical activity: None  Current or previous pharmacotherapy: Is interested in pharmacotherapy  Response to medication: Never tried medications   Past medical history includes:   Past Medical History:  Diagnosis Date   Abnormal Pap smear of cervix    ASCUS retested neg   Allergic rhinitis    Allergy     Heart murmur    Hemangioma    LIVER   History of kidney stones    CYST   Multiple food allergies    wheat, Milk, shrimp   Pneumonia    Vitamin B 12 deficiency      Objective:   BP 129/78   Pulse 95   Temp 98.1 F (36.7 C)   Ht 5' 1.5 (1.562 m)   Wt 177 lb (80.3 kg)   SpO2 100%   BMI 32.90 kg/m  She was weighed on the bioimpedance scale: Body mass index is 32.9 kg/m.  Peak Weight:177 , Body Fat%:38.4 %, Visceral Fat Rating:9, Weight trend over the last 12 months: Increasing  General:  Alert, oriented and cooperative. Patient is in no acute distress.  Respiratory: Normal respiratory effort, no problems with respiration noted  Extremities: Normal range of motion.    Mental  Status: Normal mood and affect. Normal behavior. Normal judgment and thought content.   DIAGNOSTIC DATA REVIEWED:  BMET    Component Value Date/Time   NA 142 08/06/2023 0923   K 3.8 08/06/2023 0923   CL 99 08/06/2023 0923   CO2 23 08/06/2023 0923   GLUCOSE 110 (H) 08/06/2023 0923   GLUCOSE 104 (H) 02/04/2023 0940   BUN 11 08/06/2023 0923   CREATININE 0.99 08/06/2023 0923   CREATININE 0.92 02/04/2023 0940   CALCIUM 9.5 08/06/2023 0923   GFRNONAA >60 10/13/2019 1510   GFRAA >60 10/13/2019 1510   No results found for: HGBA1C No results found for: INSULIN  CBC    Component Value Date/Time   WBC 6.2 02/04/2023 0940   RBC 4.74 02/04/2023 0940   HGB 14.2 02/04/2023 0940   HCT 42.6 02/04/2023 0940   PLT 341 02/04/2023 0940   MCV 89.9 02/04/2023 0940   MCH 30.0 02/04/2023 0940   MCHC 33.3 02/04/2023 0940   RDW 11.7 02/04/2023 0940   Iron/TIBC/Ferritin/ %Sat No results found for: IRON, TIBC, FERRITIN, IRONPCTSAT Lipid Panel     Component Value Date/Time   CHOL 242 (H) 08/06/2023 0923   TRIG 153 (H) 08/06/2023 0923   HDL 46 08/06/2023 0923   CHOLHDL 4.9 02/04/2023 0940   VLDL 19.6 09/18/2016 0847   LDLCALC 168 (H) 08/06/2023 0923   LDLCALC 153 (H) 02/04/2023 0940   Hepatic Function Panel  Component Value Date/Time   PROT 7.3 08/06/2023 0923   ALBUMIN 4.5 08/06/2023 0923   AST 27 08/06/2023 0923   ALT 25 08/06/2023 0923   ALKPHOS 82 08/06/2023 0923   BILITOT 0.6 08/06/2023 0923   BILIDIR 0.2 09/18/2016 0847      Component Value Date/Time   TSH 3.07 02/04/2023 0940     Assessment and Plan:   Hyperlipidemia LDL is not at goal. Medication(s): no medications Cardiovascular risk factors: dyslipidemia, hypertension, and obesity (BMI >= 30 kg/m2)  Lab Results  Component Value Date   CHOL 242 (H) 08/06/2023   HDL 46 08/06/2023   LDLCALC 168 (H) 08/06/2023   TRIG 153 (H) 08/06/2023   CHOLHDL 4.9 02/04/2023   Lab Results  Component Value Date    ALT 25 08/06/2023   AST 27 08/06/2023   ALKPHOS 82 08/06/2023   BILITOT 0.6 08/06/2023   The 10-year ASCVD risk score (Arnett DK, et al., 2019) is: 2.6%   Values used to calculate the score:     Age: 48 years     Sex: Female     Is Non-Hispanic African American: No     Diabetic: No     Tobacco smoker: No     Systolic Blood Pressure: 129 mmHg     Is BP treated: Yes     HDL Cholesterol: 46 mg/dL     Total Cholesterol: 242 mg/dL  Plan:  Will avoid all trans fats.  Will read labels Will minimize saturated fats except the following: low fat meats in moderation, diary, and limited dark chocolate.    Hypertension Hypertension stable.  Medication(s): Hydrochlorothiazide  25 mg daily   BP Readings from Last 3 Encounters:  08/21/23 129/78  08/06/23 112/73  02/04/23 123/77   Lab Results  Component Value Date   CREATININE 0.99 08/06/2023   CREATININE 0.92 02/04/2023   CREATININE 1.00 (H) 01/16/2022   Lab Results  Component Value Date   GFR 74.70 09/18/2016   GFR 98.62 12/06/2013    Plan: Continue all antihypertensives at current dosages. No added salt. Will keep sodium content to 1,500 mg or less per day.     Generalized Obesity: Current BMI 32    Obesity Treatment / Action Plan:  Patient will work on garnering support from family and friends to begin weight loss journey. Will work on eliminating or reducing the presence of highly palatable, calorie dense foods in the home. Will complete provided nutritional and psychosocial assessment questionnaire before the next appointment. Will be scheduled for indirect calorimetry to determine resting energy expenditure in a fasting state.  This will allow us  to create a reduced calorie, high-protein meal plan to promote loss of fat mass while preserving muscle mass. Counseled on the health benefits of losing 5%-15% of total body weight. Was counseled on nutritional approaches to weight loss and benefits of reducing processed foods  and consuming plant-based foods and high quality protein as part of nutritional weight management. Was counseled on pharmacotherapy and role as an adjunct in weight management.   Obesity Education Performed Today:  She was weighed on the bioimpedance scale and results were discussed and documented in the synopsis.  We discussed obesity as a disease and the importance of a more detailed evaluation of all the factors contributing to the disease.  We discussed the importance of long term lifestyle changes which include nutrition, exercise and behavioral modifications as well as the importance of customizing this to her specific health and social needs.  We discussed  the benefits of reaching a healthier weight to alleviate the symptoms of existing conditions and reduce the risks of the biomechanical, metabolic and psychological effects of obesity.  Discussed New Patient/Late Arrival, and Cancellation Policies. Patient voiced understanding and allowed to ask questions.   Jessica Richmond appears to be in the action stage of change and states they are ready to start intensive lifestyle modifications and behavioral modifications.  30 minutes was spent today on this visit including the above counseling, pre-visit chart review, and post-visit documentation.  Reviewed by clinician on day of visit: allergies, medications, problem list, medical history, surgical history, family history, social history, and previous encounter notes.    Telissa Palmisano A. Delores CORDOBAO.

## 2023-09-01 ENCOUNTER — Encounter: Payer: Self-pay | Admitting: Bariatrics

## 2023-09-01 ENCOUNTER — Ambulatory Visit: Payer: BC Managed Care – PPO | Admitting: Bariatrics

## 2023-09-01 VITALS — BP 127/80 | HR 63 | Temp 97.4°F | Ht 61.5 in | Wt 174.0 lb

## 2023-09-01 DIAGNOSIS — Z1331 Encounter for screening for depression: Secondary | ICD-10-CM | POA: Diagnosis not present

## 2023-09-01 DIAGNOSIS — R748 Abnormal levels of other serum enzymes: Secondary | ICD-10-CM

## 2023-09-01 DIAGNOSIS — R7309 Other abnormal glucose: Secondary | ICD-10-CM

## 2023-09-01 DIAGNOSIS — E782 Mixed hyperlipidemia: Secondary | ICD-10-CM

## 2023-09-01 DIAGNOSIS — Z0000 Encounter for general adult medical examination without abnormal findings: Secondary | ICD-10-CM

## 2023-09-01 DIAGNOSIS — R0602 Shortness of breath: Secondary | ICD-10-CM | POA: Diagnosis not present

## 2023-09-01 DIAGNOSIS — R5383 Other fatigue: Secondary | ICD-10-CM

## 2023-09-01 DIAGNOSIS — Z6832 Body mass index (BMI) 32.0-32.9, adult: Secondary | ICD-10-CM

## 2023-09-01 DIAGNOSIS — E669 Obesity, unspecified: Secondary | ICD-10-CM | POA: Diagnosis not present

## 2023-09-01 DIAGNOSIS — I10 Essential (primary) hypertension: Secondary | ICD-10-CM

## 2023-09-01 DIAGNOSIS — E559 Vitamin D deficiency, unspecified: Secondary | ICD-10-CM

## 2023-09-01 DIAGNOSIS — E66811 Obesity, class 1: Secondary | ICD-10-CM

## 2023-09-01 NOTE — Progress Notes (Signed)
 At a Glance:  Vitals Temp: (!) 97.4 F (36.3 C) BP: 127/80 Pulse Rate: 63 SpO2: 98 %   Anthropometric Measurements Height: 5' 1.5 (1.562 m) Weight: 174 lb (78.9 kg) BMI (Calculated): 32.35 Starting Weight: 174lb   Body Composition  Body Fat %: 37.7 % Fat Mass (lbs): 65.8 lbs Muscle Mass (lbs): 103 lbs Total Body Water (lbs): 68.6 lbs Visceral Fat Rating : 9   Other Clinical Data Fasting: yes Labs: yes Today's Visit #: 1 Starting Date: 08/31/22    EKG: Normal sinus rhythm, rate 67.  Indirect Calorimeter:   Resting Metabolic Rate ( RMR):  RMR (actual): 1267 kcal RMR (calculated): 1489 kcal The calculated basal metabolic rate is 8510 kcal thus her basal metabolic rate is worse than expected.  Plan:   Indirect calorimeter completed, interpreted and reviewed with patient today and allowed to ask questions.  Discussed the implications for the chosen plan and exercise based on the RMR reading.  Will consider repeating the RMR in the future based on weight loss.    Chief Complaint:  Obesity   Subjective:  Melonee Gerstel (MR# [id number]) is a 48 y.o. female who presents for evaluation and treatment of obesity and related comorbidities.   Genean is currently in the action stage of change and ready to dedicate time achieving and maintaining a healthier weight. Justyne is interested in becoming our patient and working on intensive lifestyle modifications including (but not limited to) diet and exercise for weight loss.  Lataria has been struggling with her weight. She has been unsuccessful in either losing weight, maintaining weight loss, or reaching her healthy weight goal.  Insiya's habits were reviewed today and are as follows: she started gaining weight about 4 to 5 years ago secondary to stress, she snacks frequently in the evenings, she frequently eats larger portions than normal, and she struggles with emotional eating.   She started gaining weight about 4 years ago  secondary to job stress..   Current or previous pharmacotherapy: None and Is interested in pharmacotherapy  Response to medication: Never tried medications  Other Fatigue Nylani denies daytime somnolence and denies waking up still tired. Patient has a history of symptoms of afternoon fatigue. Annette generally gets 8 or 9 hours of sleep per night, and states that she has generally restful sleep. Snoring is present. Apneic episodes are present. Epworth Sleepiness Score is 9.   Shortness of Breath Lillie notes increasing shortness of breath with exercising and seems to be worsening over time with weight gain. She notes getting out of breath sooner with activity than she used to. This has not gotten worse recently. Jarae denies shortness of breath at rest or orthopnea.  Depression Screen Shamel's Food and Mood (modified PHQ-9) score was 5. 5-9 mild depression     02/04/2023    9:14 AM  Depression screen PHQ 2/9  Decreased Interest 0  Down, Depressed, Hopeless 0  PHQ - 2 Score 0     Assessment and Plan:   Other Fatigue Shametra does feel that her weight is causing her energy to be lower than it should be. Fatigue may be related to obesity, depression or many other causes. Labs will be ordered, and in the meanwhile, Roni will focus on self care including making healthy food choices, increasing physical activity and focusing on stress reduction.  Shortness of Breath Ottilie does not feel that she gets out of breath more easily that she used to when she exercises. Georgiana's shortness of breath appears to  be obesity related and exercise induced. She has agreed to work on weight loss and gradually increase exercise to treat her exercise induced shortness of breath. Will continue to monitor closely.  Health Maintenance:   Obesity   Plan: Will do EKG, indirect calorimetry, and labs.     Vitamin D  Deficiency Vitamin D  is not at goal of 50.  Most recent vitamin D  level was unknown. shee is at risk for vitamin D   deficiency due to obesity.  She is on OTC vitamin D3 1000 IU daily. Lab Results  Component Value Date   VD25OH 27 (L) 06/03/2019   VD25OH 56 03/22/2014    Plan: Will check for vitamin D  deficiency.   Hildred had a positive depression screening. Depression is commonly associated with obesity and often results in emotional eating behaviors. We will monitor this closely and work on CBT to help improve the non-hunger eating patterns. Referral to Psychology may be required if no improvement is seen as she continues in our clinic.     Previous labs reviewed today. Date: 08/06/23 CMP, Lipids, and glucose,   Labs done today Insulin , HgbA1c, Vit D, and Thyroid  Panel   Generalized Obesity: BMI (Calculated): 32.35   Camilia is currently in the action stage of change and her goal is to begin weight loss efforts. I recommend Lilinoe begin the structured treatment plan as follows:  She has agreed to Category 2 Plan  Exercise goals: For substantial health benefits, adults should do at least 150 minutes (2 hours and 30 minutes) a week of moderate-intensity, or 75 minutes (1 hour and 15 minutes) a week of vigorous-intensity aerobic physical activity, or an equivalent combination of moderate- and vigorous-intensity aerobic activity. Aerobic activity should be performed in episodes of at least 10 minutes, and preferably, it should be spread throughout the week.  Behavioral modification strategies:increasing lean protein intake, decreasing simple carbohydrates, increasing vegetables, increase H2O intake, decreasing sodium intake, increase high fiber foods, no skipping meals, meal planning and cooking strategies, keeping healthy foods in the home, better snacking choices, avoiding temptations, and planning for success  She was informed of the importance of frequent follow-up visits to maximize her success with intensive lifestyle modifications for her multiple health conditions. She was informed we would discuss her  lab results at her next visit unless there is a critical issue that needs to be addressed sooner. Brittlyn agreed to keep her next visit at the agreed upon time to discuss these results.  Objective:  General: Cooperative, alert, well developed, in no acute distress. HEENT: Conjunctivae and lids unremarkable. Cardiovascular: Regular rhythm.  Lungs: Normal work of breathing. Neurologic: No focal deficits.   Lab Results  Component Value Date   CREATININE 0.99 08/06/2023   BUN 11 08/06/2023   NA 142 08/06/2023   K 3.8 08/06/2023   CL 99 08/06/2023   CO2 23 08/06/2023   Lab Results  Component Value Date   ALT 25 08/06/2023   AST 27 08/06/2023   ALKPHOS 82 08/06/2023   BILITOT 0.6 08/06/2023   No results found for: HGBA1C No results found for: INSULIN  Lab Results  Component Value Date   TSH 3.07 02/04/2023   Lab Results  Component Value Date   CHOL 242 (H) 08/06/2023   HDL 46 08/06/2023   LDLCALC 168 (H) 08/06/2023   TRIG 153 (H) 08/06/2023   CHOLHDL 4.9 02/04/2023   Lab Results  Component Value Date   WBC 6.2 02/04/2023   HGB 14.2 02/04/2023   HCT  42.6 02/04/2023   MCV 89.9 02/04/2023   PLT 341 02/04/2023   No results found for: IRON, TIBC, FERRITIN  Attestation Statements:  Applicable history such as the following:  allergies, medications, problem list, medical history, surgical history, family history, social history, and previous encounter notes reviewed by clinician on day of visit:    Time spent on visit including the items listed below was 40 minutes.  -preparing to see the patient (e.g., review of tests, history, previous notes) -obtaining and/or reviewing separately obtained history -counseling and educating the patient/family/caregiver -documenting clinical information in the electronic or other health record -ordering medications, tests, or procedures -independently interpreting results and communicating results to the patient/  family/caregiver -referring and communicating with other health care professionals  -care coordination   This may have been prepared with the assistance of Engineer, Civil (consulting).  Occasional wrong-word or sound-a-like substitutions may have occurred due to the inherent limitations of voice recognition software.    Clayborne Daring, DO

## 2023-09-02 ENCOUNTER — Encounter: Payer: Self-pay | Admitting: Bariatrics

## 2023-09-02 DIAGNOSIS — R7303 Prediabetes: Secondary | ICD-10-CM | POA: Insufficient documentation

## 2023-09-02 LAB — TSH+T4F+T3FREE
Free T4: 1.15 ng/dL (ref 0.82–1.77)
T3, Free: 3.3 pg/mL (ref 2.0–4.4)
TSH: 3.62 u[IU]/mL (ref 0.450–4.500)

## 2023-09-02 LAB — VITAMIN D 25 HYDROXY (VIT D DEFICIENCY, FRACTURES): Vit D, 25-Hydroxy: 36 ng/mL (ref 30.0–100.0)

## 2023-09-02 LAB — HEMOGLOBIN A1C
Est. average glucose Bld gHb Est-mCnc: 128 mg/dL
Hgb A1c MFr Bld: 6.1 % — ABNORMAL HIGH (ref 4.8–5.6)

## 2023-09-02 LAB — INSULIN, RANDOM: INSULIN: 10.3 u[IU]/mL (ref 2.6–24.9)

## 2023-09-15 ENCOUNTER — Encounter: Payer: Self-pay | Admitting: Bariatrics

## 2023-09-15 ENCOUNTER — Ambulatory Visit (INDEPENDENT_AMBULATORY_CARE_PROVIDER_SITE_OTHER): Payer: BC Managed Care – PPO | Admitting: Bariatrics

## 2023-09-15 VITALS — BP 112/78 | HR 88 | Temp 98.2°F | Ht 61.5 in | Wt 172.0 lb

## 2023-09-15 DIAGNOSIS — E669 Obesity, unspecified: Secondary | ICD-10-CM | POA: Diagnosis not present

## 2023-09-15 DIAGNOSIS — E785 Hyperlipidemia, unspecified: Secondary | ICD-10-CM | POA: Diagnosis not present

## 2023-09-15 DIAGNOSIS — Z6831 Body mass index (BMI) 31.0-31.9, adult: Secondary | ICD-10-CM

## 2023-09-15 DIAGNOSIS — R7303 Prediabetes: Secondary | ICD-10-CM | POA: Diagnosis not present

## 2023-09-15 DIAGNOSIS — E782 Mixed hyperlipidemia: Secondary | ICD-10-CM

## 2023-09-15 NOTE — Progress Notes (Signed)
First follow-up after initial visit.        WEIGHT SUMMARY AND BIOMETRICS  Weight Lost Since Last Visit: 2lb  Weight Gained Since Last Visit: 0   Vitals Temp: 98.2 F (36.8 C) BP: 112/78 Pulse Rate: 88 SpO2: 99 %   Anthropometric Measurements Height: 5' 1.5" (1.562 m) Weight: 172 lb (78 kg) BMI (Calculated): 31.98 Weight at Last Visit: 174lb Weight Lost Since Last Visit: 2lb Weight Gained Since Last Visit: 0 Starting Weight: 174lb Total Weight Loss (lbs): 2 lb (0.907 kg)   Body Composition  Body Fat %: 36.9 % Fat Mass (lbs): 63.8 lbs Muscle Mass (lbs): 103.4 lbs Total Body Water (lbs): 68.4 lbs Visceral Fat Rating : 9   Other Clinical Data Fasting: no Labs: no Today's Visit #: 2 Starting Date: 08/31/22    OBESITY Teneisha is here to discuss her progress with her obesity treatment plan along with follow-up of her obesity related diagnoses.    Nutrition Plan: the Category 2 plan - 95% adherence.  Current exercise: weightlifting and Kayaking.  Interim History:  She is down 2 lbs since her last visit.  Eating all of the food on the plan., Protein intake is as prescribed, Is not skipping meals, and Water intake is adequate.  Initial positives regarding the dietary plan: She was glad the she did not want to count calories. She made her loaf of bread but ate less.  Initial challenges regarding  the dietary plan: " too much meat, but found other protein options.   Hunger is moderately controlled.  Cravings are moderately controlled.  Assessment/Plan:   Prediabetes Last A1c was 6.1  Medication(s): none  Lab Results  Component Value Date   HGBA1C 6.1 (H) 09/01/2023   Lab Results  Component Value Date   INSULIN 10.3 09/01/2023    Plan: Information sheet on " Insulin Resistance and Prediabetes".  Will minimize all refined carbohydrates  both sweets and starches.  Will work on the plan and exercise.  Consider both aerobic and resistance training.  Will keep protein, water, and fiber intake high.  Increase Polyunsaturated and Monounsaturated fats to increase satiety and encourage weight loss.  Aim for 7 to 9 hours of sleep nightly.  High-fiber sheet was given and discussed.  She will increase her fiber gradually up to around 35 g/day. She was given a calorie and protein goal and also we discussed the journaling sheet.    Generalized Obesity: Current BMI BMI (Calculated): 31.98    Hyperlipidemia LDL is not at goal. Medication(s): none Cardiovascular risk factors: dyslipidemia, hypertension, obesity (BMI >= 30 kg/m2), and sedentary lifestyle  Lab Results  Component Value Date   CHOL 242 (H) 08/06/2023   HDL 46 08/06/2023   LDLCALC 168 (H) 08/06/2023   TRIG 153 (H) 08/06/2023   CHOLHDL 4.9 02/04/2023   Lab Results  Component Value Date   ALT 25 08/06/2023   AST 27 08/06/2023   ALKPHOS 82 08/06/2023   BILITOT 0.6 08/06/2023   The 10-year ASCVD risk score (Arnett DK, et al., 2019) is: 1.9%   Values used to calculate the score:     Age: 48 years     Sex: Female     Is Non-Hispanic African American: No     Diabetic: No     Tobacco smoker: No     Systolic Blood Pressure: 112 mmHg     Is BP treated: Yes     HDL Cholesterol: 46 mg/dL     Total Cholesterol: 242 mg/dL  Plan:  Information sheet on healthy vs unhealthy fats.  Will avoid all trans fats.  Will read labels Will minimize saturated fats except the following: low fat meats in moderation, diary, and limited dark chocolate.   Evelean is currently in the action stage of change. As such, her goal is to continue with weight loss efforts.  She has agreed to the Category 2 plan.  Exercise goals: For substantial health benefits, adults should do at least 150 minutes (2 hours and 30 minutes) a week of moderate-intensity, or 75 minutes (1 hour and 15 minutes) a  week of vigorous-intensity aerobic physical activity, or an equivalent combination of moderate- and vigorous-intensity aerobic activity. Aerobic activity should be performed in episodes of at least 10 minutes, and preferably, it should be spread throughout the week.  She will continue her kayaking and hiking also will continue with her personal trainer.  Behavioral modification strategies: increasing lean protein intake, no meal skipping, decrease eating out, meal planning , increase water intake, better snacking choices, planning for success, increasing vegetables, increasing fiber rich foods, avoiding temptations, keep healthy foods in the home, and mindful eating.  Laconya has agreed to follow-up with our clinic in 2 weeks.   Labs reviewed today from last visit (CMP, Lipids, and glucose 12/24, and A1c, insulin and thyroid panel for 09/01/23 ).   Objective:   VITALS: Per patient if applicable, see vitals. GENERAL: Alert and in no acute distress. CARDIOPULMONARY: No increased WOB. Speaking in clear sentences.  PSYCH: Pleasant and cooperative. Speech normal rate and rhythm. Affect is appropriate. Insight and judgement are appropriate. Attention is focused, linear, and appropriate.  NEURO: Oriented as arrived to appointment on time with no prompting.   Attestation Statements:   This was prepared with the assistance of Engineer, civil (consulting).  Occasional wrong-word or sound-a-like substitutions may have occurred due to the inherent limitations of voice recognition software.   Corinna Capra, DO

## 2023-10-01 ENCOUNTER — Encounter: Payer: Self-pay | Admitting: Bariatrics

## 2023-10-01 ENCOUNTER — Ambulatory Visit: Payer: BC Managed Care – PPO | Admitting: Bariatrics

## 2023-10-01 VITALS — BP 111/74 | HR 85 | Temp 97.5°F | Ht 61.5 in | Wt 168.0 lb

## 2023-10-01 DIAGNOSIS — Z6831 Body mass index (BMI) 31.0-31.9, adult: Secondary | ICD-10-CM | POA: Diagnosis not present

## 2023-10-01 DIAGNOSIS — E669 Obesity, unspecified: Secondary | ICD-10-CM | POA: Diagnosis not present

## 2023-10-01 DIAGNOSIS — E785 Hyperlipidemia, unspecified: Secondary | ICD-10-CM

## 2023-10-01 DIAGNOSIS — E66811 Obesity, class 1: Secondary | ICD-10-CM

## 2023-10-01 DIAGNOSIS — E782 Mixed hyperlipidemia: Secondary | ICD-10-CM

## 2023-10-01 NOTE — Progress Notes (Signed)
WEIGHT SUMMARY AND BIOMETRICS  Weight Lost Since Last Visit: 4lb  Weight Gained Since Last Visit: 0   Vitals Temp: (!) 97.5 F (36.4 C) BP: 111/74 Pulse Rate: 85 SpO2: 98 %   Anthropometric Measurements Height: 5' 1.5" (1.562 m) Weight: 168 lb (76.2 kg) BMI (Calculated): 31.23 Weight at Last Visit: 172lb Weight Lost Since Last Visit: 4lb Weight Gained Since Last Visit: 0 Starting Weight: 174lb Total Weight Loss (lbs): 6 lb (2.722 kg)   Body Composition  Body Fat %: 34.9 % Fat Mass (lbs): 59 lbs Muscle Mass (lbs): 104.2 lbs Total Body Water (lbs): 66.8 lbs Visceral Fat Rating : 8   Other Clinical Data Fasting: yes Labs: no Today's Visit #: 3 Starting Date: 08/31/22    OBESITY Jessica Richmond is here to discuss her progress with her obesity treatment plan along with follow-up of her obesity related diagnoses.    Nutrition Plan: the Category 2 plan - 75% adherence.  Current exercise: weightlifting  Interim History:  She is down 4 lbs since her last visit. She is making good decisions.  Eating all of the food on the plan., Protein intake is as prescribed, Is not skipping meals, Not journaling consistently., and Water intake is adequate.   Hunger is well controlled. Cravings are moderately controlled.  Assessment/Plan:   Hyperlipidemia LDL is not at goal. Medication(s): none Cardiovascular risk factors: hypertension, obesity (BMI >= 30 kg/m2), and sedentary lifestyle  Lab Results  Component Value Date   CHOL 242 (H) 08/06/2023   HDL 46 08/06/2023   LDLCALC 168 (H) 08/06/2023   TRIG 153 (H) 08/06/2023   CHOLHDL 4.9 02/04/2023   Lab Results  Component Value Date   ALT 25 08/06/2023   AST 27 08/06/2023   ALKPHOS 82 08/06/2023   BILITOT 0.6 08/06/2023   The 10-year ASCVD risk score (Arnett DK, et al., 2019) is: 1.9%   Values used to calculate  the score:     Age: 48 years     Sex: Female     Is Non-Hispanic African American: No     Diabetic: No     Tobacco smoker: No     Systolic Blood Pressure: 111 mmHg     Is BP treated: Yes     HDL Cholesterol: 46 mg/dL     Total Cholesterol: 242 mg/dL  Plan:  Discussed briefly possible integrative options for elevated cholesterol if not improved when her next labs are done.  Will avoid all trans fats.  Will read labels Will minimize saturated fats except the following: low fat meats in moderation, diary, and limited dark chocolate.  Increase Omega 3 in foods.      Generalized Obesity: Current BMI BMI (Calculated): 31.23    Jessica Richmond is currently in the action stage of change. As such, her goal is to continue with weight loss efforts.  She has agreed to  the Category 2 plan.  Exercise goals: All adults should avoid inactivity. Some physical activity is better than none, and adults who participate in any amount of physical activity gain some health benefits.  Behavioral modification strategies: increasing lean protein intake, meal planning , increase water intake, better snacking choices, planning for success, increasing fiber rich foods, and mindful eating.  Jessica Richmond has agreed to follow-up with our clinic in 3 weeks.    Objective:   VITALS: Per patient if applicable, see vitals. GENERAL: Alert and in no acute distress. CARDIOPULMONARY: No increased WOB. Speaking in clear sentences.  PSYCH: Pleasant and cooperative. Speech normal rate and rhythm. Affect is appropriate. Insight and judgement are appropriate. Attention is focused, linear, and appropriate.  NEURO: Oriented as arrived to appointment on time with no prompting.   Attestation Statements:   This was prepared with the assistance of Engineer, civil (consulting).  Occasional wrong-word or sound-a-like substitutions may have occurred due to the inherent limitations of voice recognition   Jessica Capra, DO

## 2023-10-22 ENCOUNTER — Other Ambulatory Visit: Payer: Self-pay | Admitting: Family Medicine

## 2023-10-22 ENCOUNTER — Encounter: Payer: Self-pay | Admitting: Bariatrics

## 2023-10-22 ENCOUNTER — Ambulatory Visit: Payer: BC Managed Care – PPO | Admitting: Bariatrics

## 2023-10-22 VITALS — BP 106/72 | HR 95 | Temp 98.3°F | Ht 61.5 in | Wt 169.0 lb

## 2023-10-22 DIAGNOSIS — E669 Obesity, unspecified: Secondary | ICD-10-CM

## 2023-10-22 DIAGNOSIS — E782 Mixed hyperlipidemia: Secondary | ICD-10-CM

## 2023-10-22 DIAGNOSIS — Z6831 Body mass index (BMI) 31.0-31.9, adult: Secondary | ICD-10-CM | POA: Diagnosis not present

## 2023-10-22 DIAGNOSIS — I10 Essential (primary) hypertension: Secondary | ICD-10-CM

## 2023-10-22 DIAGNOSIS — Z79899 Other long term (current) drug therapy: Secondary | ICD-10-CM

## 2023-10-22 DIAGNOSIS — E6609 Other obesity due to excess calories: Secondary | ICD-10-CM

## 2023-10-22 DIAGNOSIS — E785 Hyperlipidemia, unspecified: Secondary | ICD-10-CM | POA: Diagnosis not present

## 2023-10-22 NOTE — Progress Notes (Signed)
 WEIGHT SUMMARY AND BIOMETRICS  Weight Lost Since Last Visit: 0lb  Weight Gained Since Last Visit: 1lb   Vitals Temp: 98.3 F (36.8 C) BP: 106/72 Pulse Rate: 95 SpO2: 98 %   Anthropometric Measurements Height: 5' 1.5" (1.562 m) Weight: 169 lb (76.7 kg) BMI (Calculated): 31.42 Weight at Last Visit: 168lb Weight Lost Since Last Visit: 0lb Weight Gained Since Last Visit: 1lb Starting Weight: 174lb Total Weight Loss (lbs): 5 lb (2.268 kg)   Body Composition  Body Fat %: 35.9 % Fat Mass (lbs): 60.8 lbs Muscle Mass (lbs): 103.2 lbs Total Body Water (lbs): 68 lbs Visceral Fat Rating : 8   Other Clinical Data Fasting: Yes Labs: No Today's Visit #: 4 Starting Date: 08/31/22    OBESITY Laqueena is here to discuss her progress with her obesity treatment plan along with follow-up of her obesity related diagnoses.    Nutrition Plan: the Category 2 plan - 75% adherence.  Current exercise: weightlifting  Interim History:  She is up 1 lb. The bio-impedence shows that she is up 1 1/4 lbs of water. She ate out more since her last visit.  Eating all of the food on the plan., Protein intake is as prescribed, Water intake is inadequate., and Denies polyphagia She is having some cravings in the evening. She never kept chocolate in the house.    Hunger is moderately controlled.  Cravings are moderately controlled.  Assessment/Plan:   Hyperlipidemia LDL is not at goal. Medication(s): none Cardiovascular risk factors: dyslipidemia, hypertension, obesity (BMI >= 30 kg/m2), and sedentary lifestyle  Lab Results  Component Value Date   CHOL 242 (H) 08/06/2023   HDL 46 08/06/2023   LDLCALC 168 (H) 08/06/2023   TRIG 153 (H) 08/06/2023   CHOLHDL 4.9 02/04/2023   Lab Results  Component Value Date   ALT 25 08/06/2023   AST 27 08/06/2023   ALKPHOS 82 08/06/2023    BILITOT 0.6 08/06/2023   The 10-year ASCVD risk score (Arnett DK, et al., 2019) is: 1.7%   Values used to calculate the score:     Age: 48 years     Sex: Female     Is Non-Hispanic African American: No     Diabetic: No     Tobacco smoker: No     Systolic Blood Pressure: 106 mmHg     Is BP treated: Yes     HDL Cholesterol: 46 mg/dL     Total Cholesterol: 242 mg/dL  Plan:  Continue statin.  Information sheet on healthy vs unhealthy fats.  Will avoid all trans fats.  Will read labels Will minimize saturated fats except the following: low fat meats in moderation, diary, and limited dark chocolate.  She will increase her water intake.  She will increase her walking.     Generalized Obesity: Current BMI BMI (Calculated): 31.42    Kaneisha is currently  in the action stage of change. As such, her goal is to continue with weight loss efforts.  She has agreed to the Category 2 plan.  Exercise goals: All adults should avoid inactivity. Some physical activity is better than none, and adults who participate in any amount of physical activity gain some health benefits. She will do aerobic exercise.   Behavioral modification strategies: increasing lean protein intake, meal planning , planning for success, increasing vegetables, avoiding temptations, and keep healthy foods in the home.  Etosha has agreed to follow-up with our clinic in 3 weeks.       Objective:   VITALS: Per patient if applicable, see vitals. GENERAL: Alert and in no acute distress. CARDIOPULMONARY: No increased WOB. Speaking in clear sentences.  PSYCH: Pleasant and cooperative. Speech normal rate and rhythm. Affect is appropriate. Insight and judgement are appropriate. Attention is focused, linear, and appropriate.  NEURO: Oriented as arrived to appointment on time with no prompting.   Attestation Statements:    This was prepared with the assistance of Engineer, civil (consulting).  Occasional wrong-word or sound-a-like  substitutions may have occurred due to the inherent limitations of voice recognition   Corinna Capra, DO

## 2023-11-13 ENCOUNTER — Ambulatory Visit: Admitting: Bariatrics

## 2023-11-13 ENCOUNTER — Encounter: Payer: Self-pay | Admitting: Bariatrics

## 2023-11-13 VITALS — BP 113/78 | HR 81 | Temp 98.1°F | Ht 61.5 in | Wt 170.0 lb

## 2023-11-13 DIAGNOSIS — E669 Obesity, unspecified: Secondary | ICD-10-CM | POA: Diagnosis not present

## 2023-11-13 DIAGNOSIS — R748 Abnormal levels of other serum enzymes: Secondary | ICD-10-CM

## 2023-11-13 DIAGNOSIS — Z6831 Body mass index (BMI) 31.0-31.9, adult: Secondary | ICD-10-CM | POA: Diagnosis not present

## 2023-11-13 DIAGNOSIS — E6609 Other obesity due to excess calories: Secondary | ICD-10-CM

## 2023-11-13 NOTE — Progress Notes (Signed)
 WEIGHT SUMMARY AND BIOMETRICS  Weight Lost Since Last Visit: 0  Weight Gained Since Last Visit: 1lb   Vitals Temp: 98.1 F (36.7 C) BP: 113/78 Pulse Rate: 81 SpO2: 99 %   Anthropometric Measurements Height: 5' 1.5" (1.562 m) Weight: 170 lb (77.1 kg) BMI (Calculated): 31.6 Weight at Last Visit: 169lb Weight Lost Since Last Visit: 0 Weight Gained Since Last Visit: 1lb Starting Weight: 174lb Total Weight Loss (lbs): 4 lb (1.814 kg)   Body Composition  Body Fat %: 36 % Fat Mass (lbs): 61.4 lbs Muscle Mass (lbs): 103.4 lbs Total Body Water (lbs): 67.8 lbs Visceral Fat Rating : 8   Other Clinical Data Fasting: yes Labs: no Today's Visit #: 5 Starting Date: 08/31/22    OBESITY Jessica Richmond is here to discuss her progress with her obesity treatment plan along with follow-up of her obesity related diagnoses.    Nutrition Plan: the Category 2 plan - 85% adherence.  Current exercise: walking, weightlifting, and kayaking.  Interim History:  She is up 1 lb since her last visit.  Eating all of the food on the plan., Protein intake is as prescribed, Is not skipping meals, and Meeting calorie goals.  Hunger is moderately controlled.  Cravings are poorly controlled. She is dealing with the stress better. She has more cravings in the afternoon.  Assessment/Plan:   Elevated liver enzymes:   She has a history of elevated liver enzymes.   Plan:  Will follow her liver enzymes over time.  Will work on the plan and activities at about 85 to 90 %. Will keep her water intake high.  She will reduce her calories by 100 to 150.  She will have a protein shake daily.    Generalized Obesity: Current BMI BMI (Calculated): 31.6   Jessica Richmond is currently in the action stage of change. As such, her goal is to continue with weight loss efforts.  She has agreed to the Category 2  plan.  Exercise goals: All adults should avoid inactivity. Some physical activity is better than none, and adults who participate in any amount of physical activity gain some health benefits. She will go back to more exercising.   Behavioral modification strategies: increasing lean protein intake, decreasing simple carbohydrates , no meal skipping, meal planning , increase water intake, better snacking choices, planning for success, avoiding temptations, keep healthy foods in the home, pack lunch for work, and mindful eating.  Jessica Richmond has agreed to follow-up with our clinic in 2 weeks.      Objective:   VITALS: Per patient if applicable, see vitals. GENERAL: Alert and in no acute distress. CARDIOPULMONARY: No increased WOB. Speaking in clear sentences.  PSYCH: Pleasant and cooperative. Speech normal rate and rhythm. Affect is appropriate. Insight and judgement are appropriate. Attention is focused, linear, and appropriate.  NEURO: Oriented as arrived to appointment on time with no  prompting.   Attestation Statements:    This was prepared with the assistance of Engineer, civil (consulting).  Occasional wrong-word or sound-a-like substitutions may have occurred due to the inherent limitations of voice recognition   Corinna Capra, DO

## 2023-11-27 ENCOUNTER — Encounter: Payer: Self-pay | Admitting: Bariatrics

## 2023-11-27 ENCOUNTER — Ambulatory Visit: Admitting: Bariatrics

## 2023-11-27 VITALS — BP 122/77 | HR 66 | Temp 97.8°F | Ht 61.5 in | Wt 169.0 lb

## 2023-11-27 DIAGNOSIS — E669 Obesity, unspecified: Secondary | ICD-10-CM

## 2023-11-27 DIAGNOSIS — E782 Mixed hyperlipidemia: Secondary | ICD-10-CM | POA: Diagnosis not present

## 2023-11-27 DIAGNOSIS — E66811 Obesity, class 1: Secondary | ICD-10-CM

## 2023-11-27 DIAGNOSIS — Z6831 Body mass index (BMI) 31.0-31.9, adult: Secondary | ICD-10-CM | POA: Diagnosis not present

## 2023-11-27 NOTE — Progress Notes (Signed)
 WEIGHT SUMMARY AND BIOMETRICS  Weight Lost Since Last Visit: 1lb  Weight Gained Since Last Visit: 0   Vitals Temp: 97.8 F (36.6 C) BP: 122/77 Pulse Rate: 66 SpO2: 98 %   Anthropometric Measurements Height: 5' 1.5" (1.562 m) Weight: 169 lb (76.7 kg) BMI (Calculated): 31.42 Weight at Last Visit: 170lb Weight Lost Since Last Visit: 1lb Weight Gained Since Last Visit: 0 Starting Weight: 174lb Total Weight Loss (lbs): 5 lb (2.268 kg)   Body Composition  Body Fat %: 35.9 % Fat Mass (lbs): 61 lbs Muscle Mass (lbs): 103.2 lbs Total Body Water (lbs): 68.2 lbs Visceral Fat Rating : 8   Other Clinical Data Fasting: yes Labs: no Today's Visit #: 6 Starting Date: 08/31/22    OBESITY [name] is here to discuss her progress with her obesity treatment plan along with follow-up of her obesity related diagnoses.    Nutrition Plan: the Category 2 plan - 90% adherence.  Current exercise: walking and weightlifting  Interim History:  She is down 1 lb since her last visit.  Eating all of the food on the plan., Protein intake is as prescribed, Is not skipping meals, and Water intake is adequate. She has increased her work-out routines.   Hunger is moderately controlled.  Cravings are moderately controlled.  Assessment/Plan:   Mixed Hyperlipidemia:   LDL is not at goal. Medication(s): none Cardiovascular risk factors: dyslipidemia, obesity (BMI >= 30 kg/m2), and sedentary lifestyle  Lab Results  Component Value Date   CHOL 242 (H) 08/06/2023   HDL 46 08/06/2023   LDLCALC 168 (H) 08/06/2023   TRIG 153 (H) 08/06/2023   CHOLHDL 4.9 02/04/2023   Lab Results  Component Value Date   ALT 25 08/06/2023   AST 27 08/06/2023   ALKPHOS 82 08/06/2023   BILITOT 0.6 08/06/2023   The 10-year ASCVD risk score (Arnett DK, et al., 2019) is: 2.3%   Values used to  calculate the score:     Age: 48 years     Sex: Female     Is Non-Hispanic African American: No     Diabetic: No     Tobacco smoker: No     Systolic Blood Pressure: 122 mmHg     Is BP treated: Yes     HDL Cholesterol: 46 mg/dL     Total Cholesterol: 242 mg/dL  Plan:   Will avoid all trans fats.  Will read labels Will minimize saturated fats except the following: low fat meats in moderation, diary, and limited dark chocolate.  Increase Omega 3 in foods, and will work on a better Omega 3 to Omega 6 ratio.  She will increase her journaling and monitor her calories and protein.     Generalized Obesity: Current BMI BMI (Calculated): 31.42   Jessica Richmond is currently in the action stage of change. As such, her goal is to continue with  weight loss efforts.  She has agreed to the Category 2 plan.  Exercise goals: All adults should avoid inactivity. Some physical activity is better than none, and adults who participate in any amount of physical activity gain some health benefits. She has increased her workouts.   Behavioral modification strategies: increasing lean protein intake, decreasing simple carbohydrates , no meal skipping, meal planning , increase water intake, better snacking choices, increasing vegetables, increasing fiber rich foods, avoiding temptations, keep healthy foods in the home, and weigh protein portions.  Jessica Richmond has agreed to follow-up with our clinic in 3 weeks.     Objective:   VITALS: Per patient if applicable, see vitals. GENERAL: Alert and in no acute distress. CARDIOPULMONARY: No increased WOB. Speaking in clear sentences.  PSYCH: Pleasant and cooperative. Speech normal rate and rhythm. Affect is appropriate. Insight and judgement are appropriate. Attention is focused, linear, and appropriate.  NEURO: Oriented as arrived to appointment on time with no prompting.   Attestation Statements:    This was prepared with the assistance of Engineer, civil (consulting).   Occasional wrong-word or sound-a-like substitutions may have occurred due to the inherent limitations of voice recognition   Jessica Capra, DO

## 2023-12-18 ENCOUNTER — Encounter: Payer: Self-pay | Admitting: Bariatrics

## 2023-12-18 ENCOUNTER — Ambulatory Visit: Admitting: Bariatrics

## 2023-12-18 VITALS — BP 122/85 | HR 70 | Temp 98.0°F | Ht 61.5 in | Wt 167.0 lb

## 2023-12-18 DIAGNOSIS — I10 Essential (primary) hypertension: Secondary | ICD-10-CM

## 2023-12-18 DIAGNOSIS — Z6831 Body mass index (BMI) 31.0-31.9, adult: Secondary | ICD-10-CM | POA: Diagnosis not present

## 2023-12-18 DIAGNOSIS — E66811 Other obesity due to excess calories: Secondary | ICD-10-CM

## 2023-12-18 NOTE — Progress Notes (Signed)
 WEIGHT SUMMARY AND BIOMETRICS  Weight Lost Since Last Visit: 2lb  Weight Gained Since Last Visit: 0   Vitals Temp: 98 F (36.7 C) BP: 122/85 Pulse Rate: 70 SpO2: 99 %   Anthropometric Measurements Height: 5' 1.5" (1.562 m) Weight: 167 lb (75.8 kg) BMI (Calculated): 31.05 Weight at Last Visit: 169lb Weight Lost Since Last Visit: 2lb Weight Gained Since Last Visit: 0 Starting Weight: 174lb Total Weight Loss (lbs): 7 lb (3.175 kg)   Body Composition  Body Fat %: 34.7 % Fat Mass (lbs): 58 lbs Muscle Mass (lbs): 103.4 lbs Total Body Water (lbs): 69.4 lbs Visceral Fat Rating : 8   Other Clinical Data Fasting: no Labs: no Today's Visit #: 7 Starting Date: 08/31/22    OBESITY Jessica Richmond is here to discuss her progress with her obesity treatment plan along with follow-up of her obesity related diagnoses.    Nutrition Plan: the Category 2 plan - 90% adherence.  Current exercise: walking, weightlifting, and yard work  Interim History:  She is down another 2 lbs since her last visit.  She is journaling her food but not tracking her calories or protein on a regular basis.  Eating all of the food on the plan., Protein intake is as prescribed, Is not skipping meals, Not journaling consistently., and Meeting protein goals. Most of the time.   Hunger is moderately controlled.  Cravings are moderately controlled.  Assessment/Plan:   Hypertension Hypertension well controlled.  Medication(s): Hydrochlorothiazide  25 mg daily   BP Readings from Last 3 Encounters:  12/18/23 122/85  11/27/23 122/77  11/13/23 113/78   Lab Results  Component Value Date   CREATININE 0.99 08/06/2023   CREATININE 0.92 02/04/2023   CREATININE 1.00 (H) 01/16/2022   Lab Results  Component Value Date   GFR 74.70 09/18/2016   GFR 98.62 12/06/2013    Plan: Continue all  antihypertensives at current dosages. No added salt. Will keep sodium content to 1,500 mg or less per day.   She will journal her food using her lose it app. She will track her calories and try to stay at around 1200 cal and 80 to 90 g of protein.  She will continue continue working with her trainer at least 2 days a week and will add in of her exercises as able.   Morbid Obesity: Current BMI BMI (Calculated): 31.05  Jessica Richmond is currently in the action stage of change. As such, her goal is to continue with weight loss efforts.  She has agreed to the Category 2 plan and keeping a food journal with goal of 1,200 calories and 80 to 90 grams of protein daily.  Exercise goals: All adults should avoid inactivity. Some physical activity is better than none, and adults who participate in any amount of physical activity gain some health benefits.  Behavioral modification strategies: increasing lean protein intake, decreasing simple carbohydrates ,  no meal skipping, decrease eating out, meal planning , increase water intake, better snacking choices, planning for success, increasing vegetables, increasing fiber rich foods, keep healthy foods in the home, and weigh protein portions.  Jessica Richmond has agreed to follow-up with our clinic in 3 weeks.    Objective:   VITALS: Per patient if applicable, see vitals. GENERAL: Alert and in no acute distress. CARDIOPULMONARY: No increased WOB. Speaking in clear sentences.  PSYCH: Pleasant and cooperative. Speech normal rate and rhythm. Affect is appropriate. Insight and judgement are appropriate. Attention is focused, linear, and appropriate.  NEURO: Oriented as arrived to appointment on time with no prompting.   Attestation Statements:    This was prepared with the assistance of Engineer, civil (consulting).  Occasional wrong-word or sound-a-like substitutions may have occurred due to the inherent limitations of voice recognition   Kirk Peper, DO

## 2023-12-30 ENCOUNTER — Other Ambulatory Visit: Payer: Self-pay | Admitting: Obstetrics & Gynecology

## 2023-12-30 DIAGNOSIS — Z1231 Encounter for screening mammogram for malignant neoplasm of breast: Secondary | ICD-10-CM

## 2023-12-31 ENCOUNTER — Ambulatory Visit

## 2023-12-31 DIAGNOSIS — Z1231 Encounter for screening mammogram for malignant neoplasm of breast: Secondary | ICD-10-CM | POA: Diagnosis not present

## 2024-01-05 ENCOUNTER — Encounter: Payer: Self-pay | Admitting: Obstetrics & Gynecology

## 2024-01-05 ENCOUNTER — Other Ambulatory Visit: Payer: Self-pay | Admitting: Obstetrics & Gynecology

## 2024-01-05 ENCOUNTER — Other Ambulatory Visit (HOSPITAL_COMMUNITY)
Admission: RE | Admit: 2024-01-05 | Discharge: 2024-01-05 | Disposition: A | Discharge status: Home / Self Care | Source: Ambulatory Visit | Attending: Obstetrics & Gynecology | Admitting: Obstetrics & Gynecology

## 2024-01-05 ENCOUNTER — Ambulatory Visit: Admitting: Obstetrics & Gynecology

## 2024-01-05 VITALS — BP 132/85 | HR 71 | Ht 61.0 in | Wt 172.1 lb

## 2024-01-05 DIAGNOSIS — Z01419 Encounter for gynecological examination (general) (routine) without abnormal findings: Secondary | ICD-10-CM

## 2024-01-05 DIAGNOSIS — Z1331 Encounter for screening for depression: Secondary | ICD-10-CM | POA: Diagnosis not present

## 2024-01-05 MED ORDER — MISOPROSTOL 200 MCG PO TABS: ORAL_TABLET | ORAL | 0 refills | Status: AC

## 2024-01-05 NOTE — Progress Notes (Signed)
 Subjective:     Jessica Richmond is a 48 y.o. female here for a routine exam.  Current complaints: bumps arise on vulva during menstruation--like a white head that can be squeezed with white cheesy material    Gynecologic History No LMP recorded. (Menstrual status: IUD). Contraception: IUD Last Pap: 2024. Results were: ASCUS HPV negative Last mammogram: 5/25. Results were: normal  Obstetric History OB History  Gravida Para Term Preterm AB Living  0 0 0 0 0 0  SAB IAB Ectopic Multiple Live Births  0 0 0 0      The following portions of the patient's history were reviewed and updated as appropriate: allergies, current medications, past family history, past medical history, past social history, past surgical history, and problem list.  Review of Systems Pertinent items noted in HPI and remainder of comprehensive ROS otherwise negative.    Objective:   Vitals:   01/05/24 1024  BP: 132/85  Pulse: 71  Weight: 172 lb 1.9 oz (78.1 kg)  Height: 5\' 1"  (1.549 m)    Vitals:  WNL General appearance: alert, cooperative and no distress  HEENT: Normocephalic, without obvious abnormality, atraumatic Eyes: negative Throat: lips, mucosa, and tongue normal; teeth and gums normal  Respiratory: Clear to auscultation bilaterally  CV: Regular rate and rhythm  Breasts:  Normal appearance, no masses or tenderness, no nipple retraction or dimpling  GI: Soft, non-tender; bowel sounds normal; no masses,  no organomegaly  GU: External Genitalia:  Tanner V, no lesion Urethra:  No prolapse   Vagina: Pink, normal rugae, no blood or discharge  Cervix: No CMT, no lesion  Uterus:  Normal size and contour, non tender  Adnexa: Normal, no masses, non tender  Musculoskeletal: No edema, redness or tenderness in the calves or thighs  Skin: No lesions or rash  Lymphatic: Axillary adenopathy: none     Psychiatric: Normal mood and behavior        Assessment:    Healthy female exam.    Plan:    RPt  pap due to ASCUS (joint decision making with pt) Colon screening up to date--has to repeat in 5 years.  ?sebaceous cysts that occur during menses--Casilda will send pictures.   Yearly mammograms Continue IUD--placed 2017--should be replaced this year (pt message sent as reminder)  If wants replaced--needs cytotec  [id number]

## 2024-01-05 NOTE — Progress Notes (Signed)
 Cytotec  sent in for IUD removal and insertion.  Needs appt in next couple of months.

## 2024-01-05 NOTE — Addendum Note (Signed)
 Addended by: Lynford Sarin on: 01/05/2024 11:30 AM   Modules accepted: Orders

## 2024-01-08 ENCOUNTER — Ambulatory Visit: Admitting: Bariatrics

## 2024-01-08 LAB — CYTOLOGY - PAP
Comment: NEGATIVE
Diagnosis: NEGATIVE
High risk HPV: NEGATIVE

## 2024-01-26 ENCOUNTER — Ambulatory Visit: Payer: Self-pay | Admitting: Obstetrics & Gynecology

## 2024-01-28 ENCOUNTER — Encounter: Payer: Self-pay | Admitting: Bariatrics

## 2024-01-28 ENCOUNTER — Ambulatory Visit: Admitting: Bariatrics

## 2024-01-28 VITALS — BP 132/85 | HR 66 | Temp 98.0°F | Ht 61.5 in | Wt 161.0 lb

## 2024-01-28 DIAGNOSIS — E669 Obesity, unspecified: Secondary | ICD-10-CM

## 2024-01-28 DIAGNOSIS — Z6829 Body mass index (BMI) 29.0-29.9, adult: Secondary | ICD-10-CM

## 2024-01-28 DIAGNOSIS — E782 Mixed hyperlipidemia: Secondary | ICD-10-CM

## 2024-01-28 NOTE — Progress Notes (Addendum)
 WEIGHT SUMMARY AND BIOMETRICS  Weight Lost Since Last Visit: 6lb  Weight Gained Since Last Visit: 0   Vitals Temp: 98 F (36.7 C) BP: 132/85 Pulse Rate: 66 SpO2: 100 %   Anthropometric Measurements Height: 5' 1.5 (1.562 m) Weight: 161 lb (73 kg) BMI (Calculated): 29.93 Weight at Last Visit: 167lb Weight Lost Since Last Visit: 6lb Weight Gained Since Last Visit: 0 Starting Weight: 174lb Total Weight Loss (lbs): 13 lb (5.897 kg)   Body Composition  Body Fat %: 33.6 % Fat Mass (lbs): 54.2 lbs Muscle Mass (lbs): 101.6 lbs Total Body Water (lbs): 68 lbs Visceral Fat Rating : 7   Other Clinical Data Fasting: no Labs: no Today's Visit #: 8 Starting Date: 08/31/22    OBESITY Zosia is here to discuss her progress with her obesity treatment plan along with follow-up of her obesity related diagnoses.    Nutrition Plan: the Category 2 plan - 76.5% adherence.  Current exercise: walking, weightlifting, and kayaking.  Interim History:  She is down 6 lbs since her last visit. She had more snacking in the evening previously in the past.  Protein intake is as prescribed, Is not skipping meals, Not journaling consistently., Denies polyphagia, and Denies excessive cravings.   Hunger is moderately controlled.  Cravings are moderately controlled.  Assessment/Plan:    Mixed hyperlipidemia:   LDL is not at goal. Medication(s): none Cardiovascular risk factors: dyslipidemia, hypertension, obesity (BMI >= 30 kg/m2), and sedentary lifestyle  Lab Results  Component Value Date   CHOL 242 (H) 08/06/2023   HDL 46 08/06/2023   LDLCALC 168 (H) 08/06/2023   TRIG 153 (H) 08/06/2023   CHOLHDL 4.9 02/04/2023   Lab Results  Component Value Date   ALT 25 08/06/2023   AST 27 08/06/2023   ALKPHOS 82 08/06/2023   BILITOT 0.6 08/06/2023   The 10-year ASCVD risk score  (Arnett DK, et al., 2019) is: 2.7%   Values used to calculate the score:     Age: 53 years     Sex: Female     Is Non-Hispanic African American: No     Diabetic: No     Tobacco smoker: No     Systolic Blood Pressure: 132 mmHg     Is BP treated: Yes     HDL Cholesterol: 46 mg/dL     Total Cholesterol: 242 mg/dL  Plan:   Will avoid all trans fats.  Will read labels Will minimize saturated fats except the following: low fat meats in moderation, diary, and limited dark chocolate.  She will journal as desired and when she is in doubt regarding foods. She will keep her exercise up and continue her paddling. She will make good decisions when she eats out.    Generalized Obesity: Current BMI BMI (Calculated): 29.93    Alison is currently in the action stage of change. As  such, her goal is to continue with weight loss efforts.  She has agreed to the Category 3 plan.  Exercise goals: For additional and more extensive health benefits, adults should increase their aerobic physical activity to 300 minutes (5 hours) a week of moderate-intensity, or 150 minutes a week of vigorous-intensity aerobic physical activity, or an equivalent combination of moderate- and vigorous-intensity activity. Additional health benefits are gained by engaging in physical activity beyond this amount.  She has been more active since her last visit.   Behavioral modification strategies: increasing lean protein intake, meal planning , increase water intake, planning for success, keep healthy foods in the home, weigh protein portions, pack lunch for work, and mindful eating.  Zhana has agreed to follow-up with our clinic in 4 weeks.       Objective:   VITALS: Per patient if applicable, see vitals. GENERAL: Alert and in no acute distress. CARDIOPULMONARY: No increased WOB. Speaking in clear sentences.  PSYCH: Pleasant and cooperative. Speech normal rate and rhythm. Affect is appropriate. Insight and judgement are  appropriate. Attention is focused, linear, and appropriate.  NEURO: Oriented as arrived to appointment on time with no prompting.   Attestation Statements:   This was prepared with the assistance of Engineer, civil (consulting).  Occasional wrong-word or sound-a-like substitutions may have occurred due to the inherent limitations of voice recognition   Kirk Peper, DO

## 2024-02-04 ENCOUNTER — Ambulatory Visit (INDEPENDENT_AMBULATORY_CARE_PROVIDER_SITE_OTHER): Payer: BC Managed Care – PPO | Admitting: Family Medicine

## 2024-02-04 VITALS — BP 113/73 | HR 62 | Ht 61.5 in | Wt 168.0 lb

## 2024-02-04 DIAGNOSIS — E782 Mixed hyperlipidemia: Secondary | ICD-10-CM

## 2024-02-04 DIAGNOSIS — R7303 Prediabetes: Secondary | ICD-10-CM

## 2024-02-04 DIAGNOSIS — I10 Essential (primary) hypertension: Secondary | ICD-10-CM | POA: Diagnosis not present

## 2024-02-04 DIAGNOSIS — E538 Deficiency of other specified B group vitamins: Secondary | ICD-10-CM | POA: Diagnosis not present

## 2024-02-04 DIAGNOSIS — Z0000 Encounter for general adult medical examination without abnormal findings: Secondary | ICD-10-CM | POA: Diagnosis not present

## 2024-02-04 DIAGNOSIS — Z23 Encounter for immunization: Secondary | ICD-10-CM

## 2024-02-04 NOTE — Assessment & Plan Note (Addendum)
 Well adult Orders Placed This Encounter  Procedures   Tdap vaccine greater than or equal to 50yo IM   HgB A1c   Lipid panel   CMP14+EGFR   CBC with Differential/Platelet   B12  Screenings: per lab orders Immunizations: Tdap Anticipatory guidance/Risk Factor reduction:  Recommendations per AVS.

## 2024-02-04 NOTE — Progress Notes (Signed)
 Jessica Richmond - 48 y.o. female MRN [id number]  Date of birth: [birth year]  Subjective Chief Complaint  Patient presents with   Annual Exam    HPI Jessica Richmond is a 48 y.o. female here today for annual exam.   She reports that she is doing pretty well.   She has been seeing [facility] and weight was down to 161lbs.  She is staying pretty active.  She feels that diet is pretty good.   She is a non-smoker.  Occasional EtOH.  Review of Systems  Constitutional:  Negative for chills, fever, malaise/fatigue and weight loss.  HENT:  Negative for congestion, ear pain and sore throat.   Eyes:  Negative for blurred vision, double vision and pain.  Respiratory:  Negative for cough and shortness of breath.   Cardiovascular:  Negative for chest pain and palpitations.  Gastrointestinal:  Negative for abdominal pain, blood in stool, constipation, heartburn and nausea.  Genitourinary:  Negative for dysuria and urgency.  Musculoskeletal:  Negative for joint pain and myalgias.  Neurological:  Negative for dizziness and headaches.  Endo/Heme/Allergies:  Does not bruise/bleed easily.  Psychiatric/Behavioral:  Negative for depression. The patient is not nervous/anxious and does not have insomnia.     Allergies  Allergen Reactions   Keflex [Cephalexin] Hives   Indomethacin Nausea Only, Other (See Comments) and Nausea And Vomiting    Dizziness   Milk-Related Compounds    Shrimp [Shellfish Allergy ]    Wheat     Past Medical History:  Diagnosis Date   Abnormal Pap smear of cervix    ASCUS retested neg   Allergic rhinitis    Allergy     Arthritis 2021   Confirmed in foot via xray and visit to podiatrist   Fatty liver    Heart murmur    Hemangioma    LIVER   High blood pressure    High cholesterol    History of kidney stones    CYST   Lactose intolerance    Multiple food allergies    wheat, Milk, shrimp   Pneumonia    Sleep apnea    Vitamin B 12 deficiency      Past Surgical History:  Procedure Laterality Date   CHOLECYSTECTOMY N/A 10/18/2019   Procedure: LAPAROSCOPIC CHOLECYSTECTOMY;  Surgeon: Aldean Hummingbird, MD;  Location: [facility];  Service: General;  Laterality: N/A;   HIP ARTHROSCOPY Left    torn labrumin   WISDOM TOOTH EXTRACTION      Social History   Socioeconomic History   Marital status: Single    Spouse name: Not on file   Number of children: 0   Years of education: Not on file   Highest education level: Master's degree (e.g., MA, MS, MEng, MEd, MSW, MBA)  Occupational History   Occupation: arts Chartered certified accountant: [employer]  Tobacco Use   Smoking status: Never   Smokeless tobacco: Never  Vaping Use   Vaping status: Never Used  Substance and Sexual Activity   Alcohol use: Yes    Alcohol/week: 1.0 standard drink of alcohol    Types: 1 Standard drinks or equivalent per week    Comment: social   Drug use: No   Sexual activity: Not Currently    Partners: Male    Birth control/protection: None, I.U.D.  Other Topics Concern   Not on file  Social History Narrative   Not on file   Social Drivers of Health   Financial Resource Strain: Low Risk  (02/04/2024)  Overall Financial Resource Strain (CARDIA)    Difficulty of Paying Living Expenses: Not very hard  Food Insecurity: No Food Insecurity (02/04/2024)   Hunger Vital Sign    Worried About Running Out of Food in the Last Year: Never true    Ran Out of Food in the Last Year: Never true  Transportation Needs: No Transportation Needs (02/04/2024)   PRAPARE - Administrator, Civil Service (Medical): No    Lack of Transportation (Non-Medical): No  Physical Activity: Sufficiently Active (02/04/2024)   Exercise Vital Sign    Days of Exercise per Week: 5 days    Minutes of Exercise per Session: 40 min  Stress: No Stress Concern Present (02/04/2024)   [employer] of Occupational Health - Occupational Stress Questionnaire    Feeling of Stress: Not at all   Social Connections: Socially Isolated (02/04/2024)   Social Connection and Isolation Panel    Frequency of Communication with Friends and Family: Once a week    Frequency of Social Gatherings with Friends and Family: Once a week    Attends Religious Services: Never    Database administrator or Organizations: No    Attends Engineer, structural: Not on file    Marital Status: Never married    Family History  Problem Relation Age of Onset   Heart disease Mother 75       A fib   Fibrocystic breast disease Mother        partial mastectomy   Hypertension Father    Diabetes Father    Diverticulosis Father    Depression Father    High Cholesterol Father    Anxiety disorder Father    Sleep apnea Father    Arthritis Father    Crohn's disease Sister    Ovarian cysts Sister    Raynaud syndrome Sister    Depression Sister    Uterine cancer Other        maternal great grandmother   Colon cancer Neg Hx    Esophageal cancer Neg Hx    Rectal cancer Neg Hx    Stomach cancer Neg Hx     Health Maintenance  Topic Date Due   DTaP/Tdap/Td (2 - Td or Tdap) 12/07/2023   COVID-19 Vaccine (4 - Mixed Product risk 2024-25 season) 02/04/2024   Hepatitis C Screening  02/04/2024 (Originally 02/27/1994)   HIV Screening  02/04/2024 (Originally 02/26/1989)   INFLUENZA VACCINE  03/19/2024   Colonoscopy  10/20/2026   Cervical Cancer Screening (HPV/Pap Cotest)  01/04/2029   HPV VACCINES  Aged Out   Meningococcal B Vaccine  Aged Out     ----------------------------------------------------------------------------------------------------------------------------------------------------------------------------------------------------------------- Physical Exam BP 113/73 (BP Location: Left Arm, Patient Position: Sitting, Cuff Size: Normal)   Pulse 62   Ht 5' 1.5 (1.562 m)   Wt 168 lb (76.2 kg)   LMP 12/22/2023 (Approximate)   SpO2 100%   BMI 31.23 kg/m   Physical Exam Constitutional:       General: She is not in acute distress. HENT:     Head: Normocephalic and atraumatic.     Right Ear: Tympanic membrane and ear canal normal.     Left Ear: Tympanic membrane and ear canal normal.     Nose: Nose normal.   Eyes:     General: No scleral icterus.    Conjunctiva/sclera: Conjunctivae normal.     Comments: Small, resolving hordeolum R upper, outer lid.   Neck:     Thyroid : No thyromegaly.   Cardiovascular:  Rate and Rhythm: Normal rate and regular rhythm.     Heart sounds: Normal heart sounds.  Pulmonary:     Effort: Pulmonary effort is normal.     Breath sounds: Normal breath sounds.  Abdominal:     General: Bowel sounds are normal. There is no distension.     Palpations: Abdomen is soft.     Tenderness: There is no abdominal tenderness. There is no guarding.   Musculoskeletal:        General: Normal range of motion.     Cervical back: Normal range of motion and neck supple.  Lymphadenopathy:     Cervical: No cervical adenopathy.   Skin:    General: Skin is warm and dry.     Findings: No rash.   Neurological:     General: No focal deficit present.     Mental Status: She is alert and oriented to person, place, and time.     Cranial Nerves: No cranial nerve deficit.     Coordination: Coordination normal.   Psychiatric:        Mood and Affect: Mood normal.        Behavior: Behavior normal.     ------------------------------------------------------------------------------------------------------------------------------------------------------------------------------------------------------------------- Assessment and Plan  Well adult exam Well adult Orders Placed This Encounter  Procedures   Tdap vaccine greater than or equal to 7yo IM   HgB A1c   Lipid panel   CMP14+EGFR   CBC with Differential/Platelet   B12  Screenings: per lab orders Immunizations: Tdap Anticipatory guidance/Risk Factor reduction:  Recommendations per AVS.    No orders of  the defined types were placed in this encounter.   No follow-ups on file.

## 2024-02-04 NOTE — Patient Instructions (Signed)
 Preventive Care 16-50 Years Old, Female  Preventive care refers to lifestyle choices and visits with your health care provider that can promote health and wellness. Preventive care visits are also called wellness exams.  What can I expect for my preventive care visit?  Counseling  Your health care provider may ask you questions about your:  Medical history, including:  Past medical problems.  Family medical history.  Pregnancy history.  Current health, including:  Menstrual cycle.  Method of birth control.  Emotional well-being.  Home life and relationship well-being.  Sexual activity and sexual health.  Lifestyle, including:  Alcohol, nicotine or tobacco, and drug use.  Access to firearms.  Diet, exercise, and sleep habits.  Work and work Astronomer.  Sunscreen use.  Safety issues such as seatbelt and bike helmet use.  Physical exam  Your health care provider will check your:  Height and weight. These may be used to calculate your BMI (body mass index). BMI is a measurement that tells if you are at a healthy weight.  Waist circumference. This measures the distance around your waistline. This measurement also tells if you are at a healthy weight and may help predict your risk of certain diseases, such as type 2 diabetes and high blood pressure.  Heart rate and blood pressure.  Body temperature.  Skin for abnormal spots.  What immunizations do I need?    Vaccines are usually given at various ages, according to a schedule. Your health care provider will recommend vaccines for you based on your age, medical history, and lifestyle or other factors, such as travel or where you work.  What tests do I need?  Screening  Your health care provider may recommend screening tests for certain conditions. This may include:  Lipid and cholesterol levels.  Diabetes screening. This is done by checking your blood sugar (glucose) after you have not eaten for a while (fasting).  Pelvic exam and Pap test.  Hepatitis B test.  Hepatitis C  test.  HIV (human immunodeficiency virus) test.  STI (sexually transmitted infection) testing, if you are at risk.  Lung cancer screening.  Colorectal cancer screening.  Mammogram. Talk with your health care provider about when you should start having regular mammograms. This may depend on whether you have a family history of breast cancer.  BRCA-related cancer screening. This may be done if you have a family history of breast, ovarian, tubal, or peritoneal cancers.  Bone density scan. This is done to screen for osteoporosis.  Talk with your health care provider about your test results, treatment options, and if necessary, the need for more tests.  Follow these instructions at home:  Eating and drinking    Eat a diet that includes fresh fruits and vegetables, whole grains, lean protein, and low-fat dairy products.  Take vitamin and mineral supplements as recommended by your health care provider.  Do not drink alcohol if:  Your health care provider tells you not to drink.  You are pregnant, may be pregnant, or are planning to become pregnant.  If you drink alcohol:  Limit how much you have to 0-1 drink a day.  Know how much alcohol is in your drink. In the [place] one drink equals one 12 oz bottle of beer (355 mL), one 5 oz glass of wine (148 mL), or one 1 oz glass of hard liquor (44 mL).  Lifestyle  Brush your teeth every morning and night with fluoride toothpaste. Floss one time each day.  Exercise for at least  30 minutes 5 or more days each week.  Do not use any products that contain nicotine or tobacco. These products include cigarettes, chewing tobacco, and vaping devices, such as e-cigarettes. If you need help quitting, ask your health care provider.  Do not use drugs.  If you are sexually active, practice safe sex. Use a condom or other form of protection to prevent STIs.  If you do not wish to become pregnant, use a form of birth control. If you plan to become pregnant, see your health care provider for a  prepregnancy visit.  Take aspirin only as told by your health care provider. Make sure that you understand how much to take and what form to take. Work with your health care provider to find out whether it is safe and beneficial for you to take aspirin daily.  Find healthy ways to manage stress, such as:  Meditation, yoga, or listening to music.  Journaling.  Talking to a trusted person.  Spending time with friends and family.  Minimize exposure to UV radiation to reduce your risk of skin cancer.  Safety  Always wear your seat belt while driving or riding in a vehicle.  Do not drive:  If you have been drinking alcohol. Do not ride with someone who has been drinking.  When you are tired or distracted.  While texting.  If you have been using any mind-altering substances or drugs.  Wear a helmet and other protective equipment during sports activities.  If you have firearms in your house, make sure you follow all gun safety procedures.  Seek help if you have been physically or sexually abused.  What's next?  Visit your health care provider once a year for an annual wellness visit.  Ask your health care provider how often you should have your eyes and teeth checked.  Stay up to date on all vaccines.  This information is not intended to replace advice given to you by your health care provider. Make sure you discuss any questions you have with your health care provider.  Document Revised: 01/31/2021 Document Reviewed: 01/31/2021  Elsevier Patient Education  2024 [employer].

## 2024-02-05 LAB — CBC WITH DIFFERENTIAL/PLATELET
Basophils Absolute: 0 10*3/uL (ref 0.0–0.2)
Basos: 1 %
EOS (ABSOLUTE): 0.1 10*3/uL (ref 0.0–0.4)
Eos: 1 %
Hematocrit: 44.8 % (ref 34.0–46.6)
Hemoglobin: 14.6 g/dL (ref 11.1–15.9)
Immature Grans (Abs): 0 10*3/uL (ref 0.0–0.1)
Immature Granulocytes: 0 %
Lymphocytes Absolute: 2.7 10*3/uL (ref 0.7–3.1)
Lymphs: 37 %
MCH: 30.3 pg (ref 26.6–33.0)
MCHC: 32.6 g/dL (ref 31.5–35.7)
MCV: 93 fL (ref 79–97)
Monocytes Absolute: 0.4 10*3/uL (ref 0.1–0.9)
Monocytes: 5 %
Neutrophils Absolute: 4.1 10*3/uL (ref 1.4–7.0)
Neutrophils: 56 %
Platelets: 330 10*3/uL (ref 150–450)
RBC: 4.82 x10E6/uL (ref 3.77–5.28)
RDW: 12.2 % (ref 11.7–15.4)
WBC: 7.3 10*3/uL (ref 3.4–10.8)

## 2024-02-05 LAB — HEMOGLOBIN A1C
Est. average glucose Bld gHb Est-mCnc: 114 mg/dL
Hgb A1c MFr Bld: 5.6 % (ref 4.8–5.6)

## 2024-02-05 LAB — LIPID PANEL
Chol/HDL Ratio: 5.1 ratio — ABNORMAL HIGH (ref 0.0–4.4)
Cholesterol, Total: 255 mg/dL — ABNORMAL HIGH (ref 100–199)
HDL: 50 mg/dL (ref >39)
LDL Chol Calc (NIH): 178 mg/dL — ABNORMAL HIGH (ref 0–99)
Triglycerides: 146 mg/dL (ref 0–149)
VLDL Cholesterol Cal: 27 mg/dL (ref 5–40)

## 2024-02-05 LAB — CMP14+EGFR
ALT: 16 IU/L (ref 0–32)
AST: 19 IU/L (ref 0–40)
Albumin: 4.6 g/dL (ref 3.9–4.9)
Alkaline Phosphatase: 90 IU/L (ref 44–121)
BUN/Creatinine Ratio: 23 (ref 9–23)
BUN: 22 mg/dL (ref 6–24)
Bilirubin Total: 0.4 mg/dL (ref 0.0–1.2)
CO2: 21 mmol/L (ref 20–29)
Calcium: 9.8 mg/dL (ref 8.7–10.2)
Chloride: 103 mmol/L (ref 96–106)
Creatinine, Ser: 0.94 mg/dL (ref 0.57–1.00)
Globulin, Total: 2.4 g/dL (ref 1.5–4.5)
Glucose: 96 mg/dL (ref 70–99)
Potassium: 4.1 mmol/L (ref 3.5–5.2)
Sodium: 143 mmol/L (ref 134–144)
Total Protein: 7 g/dL (ref 6.0–8.5)
eGFR: 74 mL/min/{1.73_m2} (ref >59)

## 2024-02-05 LAB — VITAMIN B12: Vitamin B-12: 1209 pg/mL (ref 232–1245)

## 2024-02-09 ENCOUNTER — Telehealth: Payer: Self-pay | Admitting: *Deleted

## 2024-02-09 NOTE — Telephone Encounter (Signed)
 Returned call from 11:03 AM. Left patient a message to call and schedule IUD appointment.

## 2024-02-13 ENCOUNTER — Ambulatory Visit: Payer: Self-pay | Admitting: Family Medicine

## 2024-02-25 ENCOUNTER — Encounter: Payer: Self-pay | Admitting: Bariatrics

## 2024-02-25 ENCOUNTER — Ambulatory Visit: Admitting: Bariatrics

## 2024-02-25 VITALS — BP 124/78 | HR 74 | Temp 98.2°F | Ht 61.5 in | Wt 161.0 lb

## 2024-02-25 DIAGNOSIS — R7303 Prediabetes: Secondary | ICD-10-CM

## 2024-02-25 DIAGNOSIS — Z6829 Body mass index (BMI) 29.0-29.9, adult: Secondary | ICD-10-CM | POA: Diagnosis not present

## 2024-02-25 DIAGNOSIS — E669 Obesity, unspecified: Secondary | ICD-10-CM

## 2024-02-25 NOTE — Progress Notes (Signed)
 WEIGHT SUMMARY AND BIOMETRICS  Weight Lost Since Last Visit: 0  Weight Gained Since Last Visit: 0   Vitals Temp: 98.2 F (36.8 C) BP: 124/78 Pulse Rate: 74 SpO2: 100 %   Anthropometric Measurements Height: 5' 1.5 (1.562 m) Weight: 161 lb (73 kg) BMI (Calculated): 29.93 Weight at Last Visit: 161lb Weight Lost Since Last Visit: 0 Weight Gained Since Last Visit: 0 Starting Weight: 174lb Total Weight Loss (lbs): 13 lb (5.897 kg)   Body Composition  Body Fat %: 33.6 % Fat Mass (lbs): 54.2 lbs Muscle Mass (lbs): 102 lbs Total Body Water (lbs): 67.2 lbs Visceral Fat Rating : 7   Other Clinical Data Fasting: no Labs: no Today's Visit #: 9 Starting Date: 08/31/22    OBESITY Jessica Richmond is here to discuss her progress with her obesity treatment plan along with follow-up of her obesity related diagnoses.    Nutrition Plan: the Category 2 plan - 80-85% adherence.  Current exercise: walking, weightlifting, and kayaking.   Interim History:  Her weight remains the same as her last visit.  Eating all of the food on the plan., Protein intake is as prescribed, Is not skipping meals, Journaling consistently., Water intake is adequate., and Denies polyphagia  Hunger is moderately controlled.  Cravings are moderately controlled.  Assessment/Plan:   Prediabetes Last A1c was 5.6 which is a significant drop from 6.1 earlier in the year.   Medication(s): none  Lab Results  Component Value Date   HGBA1C 5.6 02/04/2024   HGBA1C 6.1 (H) 09/01/2023   Lab Results  Component Value Date   INSULIN  10.3 09/01/2023    Plan: She will lower her calories by approximately 100 and will increase her protein by approximately 20 g. She will continue both resistance and cardio vascular training.  She paddles on the river and other water weights on a regular basis and will  continue. She will increase her mono unsaturated fatty acids and her polyunsaturated fatty acids.  She will consider starting a probiotic for GI health. She will continue to journal on a regular basis. Will minimize all refined carbohydrates both sweets and starches.  Will work on the plan and exercise.  Will keep protein, water, and fiber intake high.  Increase Polyunsaturated and Monounsaturated fats to increase satiety and encourage weight loss.  Aim for 7 to 9 hours of sleep nightly.    The 10-year ASCVD risk score (Arnett DK, et al., 2019) is: 2.3%   Values used to calculate the score:     Age: 48 years     Clincally relevant sex: Female     Is Non-Hispanic African American: No     Diabetic: No     Tobacco smoker: No     Systolic Blood Pressure: 124 mmHg     Is BP treated: Yes     HDL Cholesterol: 50 mg/dL  Total Cholesterol: 255 mg/dL     Generalized Obesity: Current BMI BMI (Calculated): 29.93    Jessica Richmond is currently in the action stage of change. As such, her goal is to continue with weight loss efforts.  She has agreed to the Category 2 plan -100 cal plus increase protein.   Exercise goals: For additional and more extensive health benefits, adults should increase their aerobic physical activity to 300 minutes (5 hours) a week of moderate-intensity, or 150 minutes a week of vigorous-intensity aerobic physical activity, or an equivalent combination of moderate- and vigorous-intensity activity. Additional health benefits are gained by engaging in physical activity beyond this amount.   Behavioral modification strategies: increasing lean protein intake, no meal skipping, meal planning , increase water intake, better snacking choices, planning for success, increasing vegetables, increasing fiber rich foods, avoiding temptations, keep healthy foods in the home, and mindful eating.  Jessica Richmond has agreed to follow-up with our clinic in 4 weeks.    Objective:   VITALS: Per patient if  applicable, see vitals. GENERAL: Alert and in no acute distress. CARDIOPULMONARY: No increased WOB. Speaking in clear sentences.  PSYCH: Pleasant and cooperative. Speech normal rate and rhythm. Affect is appropriate. Insight and judgement are appropriate. Attention is focused, linear, and appropriate.  NEURO: Oriented as arrived to appointment on time with no prompting.   Attestation Statements:   This was prepared with the assistance of Engineer, civil (consulting).  Occasional wrong-word or sound-a-like substitutions may have occurred due to the inherent limitations of voice recognition   Clayborne Daring, DO

## 2024-03-10 ENCOUNTER — Encounter: Payer: Self-pay | Admitting: Family Medicine

## 2024-03-10 MED ORDER — ERYTHROMYCIN 5 MG/GM OP OINT: 1.0000 | TOPICAL_OINTMENT | Freq: Two times a day (BID) | OPHTHALMIC | 0 refills | Status: AC

## 2024-03-25 ENCOUNTER — Ambulatory Visit: Admitting: Bariatrics

## 2024-03-25 ENCOUNTER — Encounter: Payer: Self-pay | Admitting: Bariatrics

## 2024-03-25 VITALS — BP 126/80 | HR 62 | Temp 97.6°F | Ht 61.5 in | Wt 162.0 lb

## 2024-03-25 DIAGNOSIS — E6609 Other obesity due to excess calories: Secondary | ICD-10-CM

## 2024-03-25 DIAGNOSIS — R7303 Prediabetes: Secondary | ICD-10-CM

## 2024-03-25 DIAGNOSIS — E782 Mixed hyperlipidemia: Secondary | ICD-10-CM

## 2024-03-25 DIAGNOSIS — E669 Obesity, unspecified: Secondary | ICD-10-CM | POA: Diagnosis not present

## 2024-03-25 DIAGNOSIS — Z6830 Body mass index (BMI) 30.0-30.9, adult: Secondary | ICD-10-CM | POA: Diagnosis not present

## 2024-03-25 NOTE — Progress Notes (Signed)
 WEIGHT SUMMARY AND BIOMETRICS  Weight Lost Since Last Visit: 0  Weight Gained Since Last Visit: 1lb   Vitals Temp: 97.6 F (36.4 C) BP: 126/80 Pulse Rate: 62 SpO2: 100 %   Anthropometric Measurements Height: 5' 1.5 (1.562 m) Weight: 162 lb (73.5 kg) BMI (Calculated): 30.12 Weight at Last Visit: 161lb Weight Lost Since Last Visit: 0 Weight Gained Since Last Visit: 1lb Starting Weight: 174lb Total Weight Loss (lbs): 12 lb (5.443 kg)   Body Composition  Body Fat %: 34.3 % Fat Mass (lbs): 55.6 lbs Muscle Mass (lbs): 101 lbs Total Body Water (lbs): 67 lbs Visceral Fat Rating : 8   Other Clinical Data Fasting: yes Labs: no Today's Visit #: 10 Starting Date: 08/31/22    OBESITY Robinn is here to discuss her progress with her obesity treatment plan along with follow-up of her obesity related diagnoses.    Nutrition Plan: the Category 2 plan - 85% adherence.  Current exercise: walking, weightlifting, and kayaking  Interim History:  She is up 1 lb since her last visit.  Eating all of the food on the plan., Protein intake is as prescribed, Is not skipping meals, Not journaling consistently., and Water intake is inadequate.   Pharmacotherapy: She is not on any antiobesity medications at this time. Hunger is moderately controlled.  Cravings are moderately controlled.  Assessment/Plan:   Prediabetes Last A1c was 5.6  Medication(s): none Lab Results  Component Value Date   HGBA1C 5.6 02/04/2024   HGBA1C 6.1 (H) 09/01/2023   Lab Results  Component Value Date   INSULIN  10.3 09/01/2023    Plan: Will minimize all refined carbohydrates both sweets and starches.  Will work on the plan and exercise.  Consider both aerobic and resistance training.  Will keep protein, water, and fiber intake high.  Increase Polyunsaturated and Monounsaturated fats to  increase satiety and encourage weight loss.  Aim for 7 to 9 hours of sleep nightly.   Mixed hyperlipidemia:   Her last lipid panel showed that her total cholesterol, LDL and total cholesterol/HDL ratio was slightly high.  She is not on any cholesterol medication.  Plan: She will begin the supplement [employer] algae Omega at the recommended dose. She will continue her plan and exercise. We will check her labs in the near future.   Generalized Obesity: Current BMI BMI (Calculated): 30.12    Aliceson is currently in the action stage of change. As such, her goal is to continue with weight loss efforts.  She has agreed to the Category 2 plan.  Exercise goals: For substantial health benefits, adults should do at least 150 minutes (2 hours and 30 minutes) a week of moderate-intensity, or 75 minutes (1 hour and 15 minutes) a week of vigorous-intensity aerobic physical activity, or an equivalent combination of moderate- and vigorous-intensity aerobic activity. Aerobic activity should be performed in episodes of  at least 10 minutes, and preferably, it should be spread throughout the week. She will continue to paddle.  Behavioral modification strategies: increasing lean protein intake, decreasing simple carbohydrates , decrease eating out, meal planning , increase water intake, better snacking choices, planning for success, increasing vegetables, and avoiding temptations.  Zenora has agreed to follow-up with our clinic in 4 weeks.    Objective:   VITALS: Per patient if applicable, see vitals. GENERAL: Alert and in no acute distress. CARDIOPULMONARY: No increased WOB. Speaking in clear sentences.  PSYCH: Pleasant and cooperative. Speech normal rate and rhythm. Affect is appropriate. Insight and judgement are appropriate. Attention is focused, linear, and appropriate.  NEURO: Oriented as arrived to appointment on time with no prompting.   Attestation Statements:   This was prepared with the  assistance of Engineer, civil (consulting).  Occasional wrong-word or sound-a-like substitutions may have occurred due to the inherent limitations of voice recognition   Clayborne Daring, DO

## 2024-03-29 ENCOUNTER — Telehealth: Payer: Self-pay | Admitting: *Deleted

## 2024-03-29 NOTE — Telephone Encounter (Signed)
 Returned call from 2:47 PM. Left patient a message with IUD anniversary information, 07/26/2024.

## 2024-03-30 DIAGNOSIS — H01001 Unspecified blepharitis right upper eyelid: Secondary | ICD-10-CM | POA: Diagnosis not present

## 2024-03-30 DIAGNOSIS — H10411 Chronic giant papillary conjunctivitis, right eye: Secondary | ICD-10-CM | POA: Diagnosis not present

## 2024-04-22 ENCOUNTER — Encounter: Payer: Self-pay | Admitting: Bariatrics

## 2024-04-22 ENCOUNTER — Ambulatory Visit: Admitting: Bariatrics

## 2024-04-22 VITALS — BP 106/71 | HR 66 | Temp 97.6°F | Ht 61.5 in | Wt 164.0 lb

## 2024-04-22 DIAGNOSIS — E669 Obesity, unspecified: Secondary | ICD-10-CM | POA: Diagnosis not present

## 2024-04-22 DIAGNOSIS — E6609 Other obesity due to excess calories: Secondary | ICD-10-CM

## 2024-04-22 DIAGNOSIS — Z6830 Body mass index (BMI) 30.0-30.9, adult: Secondary | ICD-10-CM

## 2024-04-22 DIAGNOSIS — R7303 Prediabetes: Secondary | ICD-10-CM | POA: Diagnosis not present

## 2024-04-22 NOTE — Progress Notes (Signed)
 WEIGHT SUMMARY AND BIOMETRICS  Weight Lost Since Last Visit: 0  Weight Gained Since Last Visit: 2lb   Vitals Temp: 97.6 F (36.4 C) BP: 106/71 Pulse Rate: 66 SpO2: 99 %   Anthropometric Measurements Height: 5' 1.5 (1.562 m) Weight: 164 lb (74.4 kg) BMI (Calculated): 30.49 Weight at Last Visit: 162lb Weight Lost Since Last Visit: 0 Weight Gained Since Last Visit: 2lb Starting Weight: 174lb Total Weight Loss (lbs): 14 lb (6.35 kg)   Body Composition  Body Fat %: 34.9 % Fat Mass (lbs): 57.2 lbs Muscle Mass (lbs): 101.4 lbs Total Body Water (lbs): 69 lbs Visceral Fat Rating : 8   Other Clinical Data Fasting: no Labs: no Today's Visit #: 11 Starting Date: 08/31/22    OBESITY Jessica Richmond is here to discuss her progress with her obesity treatment plan along with follow-up of her obesity related diagnoses.    Nutrition Plan: the Category 2 plan - 75% adherence.  Current exercise: Kayaking and hiking  Interim History:  She is up 2 lbs since her last visit. According to the bio-impedence scale, her muscle mass is up 0.4 lbs, and her water weight is up 2 lbs.  Eating all of the food on the plan., Protein intake is as prescribed, Is not skipping meals, Not journaling consistently., and Water intake is adequate.  Hunger is moderately controlled.  Cravings are moderately controlled.  Assessment/Plan:   Prediabetes Last A1c was 5.6, but was previously up to 6.1. Medication(s): none Lab Results  Component Value Date   HGBA1C 5.6 02/04/2024   HGBA1C 6.1 (H) 09/01/2023   Lab Results  Component Value Date   INSULIN  10.3 09/01/2023    Plan: ill minimize all refined carbohydrates both sweets and starches.  Will work on the plan and exercise.  Will do both aerobic and resistance training.  She will continue to do her kayaking and hiking on a regular  basis. Will keep protein, water, and fiber intake high.  Increase Polyunsaturated and Monounsaturated fats to increase satiety and encourage weight loss.  Aim for 7 to 9 hours of sleep nightly.  She will start journaling on a regular basis using her lose it app and will chart both her calories and protein. She will spread her protein out during the day and will have approximately 30 to 35 g of protein at least 3 times a day.    Generalized Obesity: Current BMI BMI (Calculated): 30.49   Pharmacotherapy Plan She is not on any antiobesity medications at this time.  Jessica Richmond is currently in the action stage of change. As such, her goal is to continue with weight loss efforts.  She has agreed to the Category 2 plan.  Exercise goals: For substantial health benefits, adults should do at least 150 minutes (2 hours and 30 minutes) a week of moderate-intensity, or 75 minutes (1 hour and 15 minutes) a  week of vigorous-intensity aerobic physical activity, or an equivalent combination of moderate- and vigorous-intensity aerobic activity. Aerobic activity should be performed in episodes of at least 10 minutes, and preferably, it should be spread throughout the week.  Behavioral modification strategies: increasing lean protein intake, decreasing simple carbohydrates , no meal skipping, meal planning , decrease liquid calories, increase water intake, better snacking choices, planning for success, increasing lower sugar fruits, decrease snacking , avoiding temptations, keep healthy foods in the home, measure portion sizes, work on smaller portions, and mindful eating.  Jessica Richmond has agreed to follow-up with our clinic in 4 weeks.    Objective:   VITALS: Per patient if applicable, see vitals. GENERAL: Alert and in no acute distress. CARDIOPULMONARY: No increased WOB. Speaking in clear sentences.  PSYCH: Pleasant and cooperative. Speech normal rate and rhythm. Affect is appropriate. Insight and judgement are  appropriate. Attention is focused, linear, and appropriate.  NEURO: Oriented as arrived to appointment on time with no prompting.   Attestation Statements:   This was prepared with the assistance of Engineer, civil (consulting).  Occasional wrong-word or sound-a-like substitutions may have occurred due to the inherent limitations of voice recognition   Clayborne Daring, DO

## 2024-05-20 ENCOUNTER — Ambulatory Visit: Admitting: Bariatrics

## 2024-05-23 ENCOUNTER — Encounter: Payer: Self-pay | Admitting: Family Medicine

## 2024-05-23 DIAGNOSIS — I10 Essential (primary) hypertension: Secondary | ICD-10-CM

## 2024-05-23 DIAGNOSIS — Z79899 Other long term (current) drug therapy: Secondary | ICD-10-CM

## 2024-05-24 MED ORDER — HYDROCHLOROTHIAZIDE 25 MG PO TABS: 25.0000 mg | ORAL_TABLET | Freq: Every day | ORAL | 0 refills | Status: DC

## 2024-06-01 ENCOUNTER — Ambulatory Visit: Admitting: Bariatrics

## 2024-06-14 ENCOUNTER — Encounter: Payer: Self-pay | Admitting: Bariatrics

## 2024-06-14 ENCOUNTER — Ambulatory Visit: Admitting: Bariatrics

## 2024-06-14 VITALS — BP 120/80 | HR 82 | Temp 97.8°F | Ht 61.5 in | Wt 163.0 lb

## 2024-06-14 DIAGNOSIS — Z6830 Body mass index (BMI) 30.0-30.9, adult: Secondary | ICD-10-CM | POA: Diagnosis not present

## 2024-06-14 DIAGNOSIS — E785 Hyperlipidemia, unspecified: Secondary | ICD-10-CM

## 2024-06-14 DIAGNOSIS — E782 Mixed hyperlipidemia: Secondary | ICD-10-CM

## 2024-06-14 NOTE — Progress Notes (Signed)
 WEIGHT SUMMARY AND BIOMETRICS  Weight Lost Since Last Visit: 1lb  Weight Gained Since Last Visit: 0   Vitals Temp: 97.8 F (36.6 C) BP: 120/80 Pulse Rate: 82 SpO2: 99 %   Anthropometric Measurements Height: 5' 1.5 (1.562 m) Weight: 163 lb (73.9 kg) BMI (Calculated): 30.3 Weight at Last Visit: 164lb Weight Lost Since Last Visit: 1lb Weight Gained Since Last Visit: 0 Starting Weight: 174lb Total Weight Loss (lbs): 15 lb (6.804 kg)   Body Composition  Body Fat %: 33.9 % Fat Mass (lbs): 55.6 lbs Muscle Mass (lbs): 102.8 lbs Total Body Water (lbs): 68 lbs Visceral Fat Rating : 8   Other Clinical Data Fasting: no Labs: no Today's Visit #: 12 Starting Date: 08/31/22    OBESITY Burnell is here to discuss her progress with her obesity treatment plan along with follow-up of her obesity related diagnoses.    Nutrition Plan: the Category 2 plan - 50% adherence.  Current exercise: walking and yard work  Interim History:  She is down 1 lb since her last visit. The bio-impedence scale shows that her muscle mass is up 1.4 lbs  and that her fat % is down 1 %.  She has been traveling  Eating all of the food on the plan., Protein intake is as prescribed, Is skipping meals, and Water intake is adequate.   Pharmacotherapy: Woodrow is not on any anti-obesity medications.  Hunger is moderately controlled.  Cravings are moderately controlled.  Assessment/Plan:   Hyperlipidemia LDL is not at goal. Medication(s): none Cardiovascular risk factors: dyslipidemia and obesity (BMI >= 30 kg/m2)  Lab Results  Component Value Date   CHOL 255 (H) 02/04/2024   HDL 50 02/04/2024   LDLCALC 178 (H) 02/04/2024   TRIG 146 02/04/2024   CHOLHDL 5.1 (H) 02/04/2024   Lab Results  Component Value Date   ALT 16 02/04/2024   AST 19 02/04/2024   ALKPHOS 90 02/04/2024   BILITOT  0.4 02/04/2024   The 10-year ASCVD risk score (Arnett DK, et al., 2019) is: 2.3%   Values used to calculate the score:     Age: 66 years     Clincally relevant sex: Female     Is Non-Hispanic African American: No     Diabetic: No     Tobacco smoker: No     Systolic Blood Pressure: 120 mmHg     Is BP treated: Yes     HDL Cholesterol: 50 mg/dL     Total Cholesterol: 255 mg/dL  Plan:  Will avoid all trans fats.  Will read labels Will minimize saturated fats except the following: low fat meats in moderation, diary, and limited dark chocolate.  Increase Omega 3 in foods, and consider an Omega 3 supplement.  Will do labs at the end of the year.      Morbid Obesity: Current BMI BMI (Calculated): 30.3  Pharmacotherapy Plan No anti-obesity medications.   Carrieann is currently in the action stage of change. As such, her goal is to continue with weight loss efforts.  She has agreed to the Category 2 plan.  Exercise goals: For substantial health benefits, adults should do at least 150 minutes (2 hours and 30 minutes) a week of moderate-intensity, or 75 minutes (1 hour and 15 minutes) a week of vigorous-intensity aerobic physical activity, or an equivalent combination of moderate- and vigorous-intensity aerobic activity. Aerobic activity should be performed in episodes of at least 10 minutes, and preferably, it should be spread throughout the week.  Behavioral modification strategies: increasing lean protein intake, decreasing simple carbohydrates , decrease eating out, meal planning , increase water intake, better snacking choices, planning for success, avoiding temptations, keep healthy foods in the home, and mindful eating.  Vestal has agreed to follow-up with our clinic in 4 weeks.    Objective:   VITALS: Per patient if applicable, see vitals. GENERAL: Alert and in no acute distress. CARDIOPULMONARY: No increased WOB. Speaking in clear sentences.  PSYCH: Pleasant and cooperative. Speech  normal rate and rhythm. Affect is appropriate. Insight and judgement are appropriate. Attention is focused, linear, and appropriate.  NEURO: Oriented as arrived to appointment on time with no prompting.   Attestation Statements:   This was prepared with the assistance of Engineer, Civil (consulting).  Occasional wrong-word or sound-a-like substitutions may have occurred due to the inherent limitations of voice recognition   Clayborne Daring, DO

## 2024-07-12 ENCOUNTER — Ambulatory Visit: Admitting: Bariatrics

## 2024-07-21 ENCOUNTER — Encounter: Payer: Self-pay | Admitting: Bariatrics

## 2024-07-21 ENCOUNTER — Ambulatory Visit: Admitting: Bariatrics

## 2024-07-21 VITALS — BP 119/78 | HR 99 | Ht 61.5 in | Wt 165.0 lb

## 2024-07-21 DIAGNOSIS — Z6830 Body mass index (BMI) 30.0-30.9, adult: Secondary | ICD-10-CM

## 2024-07-21 DIAGNOSIS — E669 Obesity, unspecified: Secondary | ICD-10-CM

## 2024-07-21 DIAGNOSIS — I10 Essential (primary) hypertension: Secondary | ICD-10-CM

## 2024-07-21 DIAGNOSIS — E6609 Other obesity due to excess calories: Secondary | ICD-10-CM

## 2024-07-21 NOTE — Progress Notes (Signed)
 WEIGHT SUMMARY AND BIOMETRICS  Weight Lost Since Last Visit: 0  Weight Gained Since Last Visit: 2lb   Vitals BP: 119/78 Pulse Rate: 99 SpO2: 100 %   Anthropometric Measurements Height: 5' 1.5 (1.562 m) Weight: 165 lb (74.8 kg) BMI (Calculated): 30.68 Weight at Last Visit: 163lb Weight Lost Since Last Visit: 0 Weight Gained Since Last Visit: 2lb Starting Weight: 174lb Total Weight Loss (lbs): 13 lb (5.897 kg)   Body Composition  Body Fat %: 34.3 % Fat Mass (lbs): 56.6 lbs Muscle Mass (lbs): 103 lbs Total Body Water (lbs): 67.6 lbs Visceral Fat Rating : 8   Other Clinical Data Fasting: no Labs: no Today's Visit #: 13 Starting Date: 08/31/22    OBESITY Jessica Richmond is here to discuss her progress with her obesity treatment plan along with follow-up of her obesity related diagnoses.    Nutrition Plan: the Category 2 plan - 80% adherence.  Current exercise: walking and weightlifting  Interim History:  She is up 2 lbs over the holiday. The bio-impedence shows that her muscle mass is up slightly.  Eating all of the food on the plan., Protein intake is as prescribed, and Water intake is adequate.   Pharmacotherapy: Jessica Richmond is not on any anti-obesity medications.  Hunger is moderately controlled.  Cravings are moderately controlled. She has cravings sometimes in the evening.  Assessment/Plan:   Hypertension Hypertension stable.  Medication(s): Hydrochlorothiazide  25 mg daily   BP Readings from Last 3 Encounters:  07/21/24 119/78  06/14/24 120/80  04/22/24 106/71   Lab Results  Component Value Date   CREATININE 0.94 02/04/2024   CREATININE 0.99 08/06/2023   CREATININE 0.92 02/04/2023   Lab Results  Component Value Date   GFR 74.70 09/18/2016   GFR 98.62 12/06/2013    Plan: Continue all antihypertensives at current dosages. No added salt. Will  keep sodium content to 1,500 mg or less per day.   Will change exercise routine from her paddling to something she can do inside such as walking on her treadmill and or doing weight training. Discussed ways to limit any snacking at night such as distraction, doing something with her hands and picking such foods such as apples that she can eat with virtual impunity.   Generalized Obesity: Current BMI BMI (Calculated): 30.68   Pharmacotherapy Plan She is not on any anti-obesity medications.   Jessica Richmond is currently in the action stage of change. As such, her goal is to continue with weight loss efforts.  She has agreed to the Category 2 plan.  Exercise goals: For substantial health benefits, adults should do at least 150 minutes (2 hours and 30 minutes) a week of moderate-intensity, or 75 minutes (1 hour and 15 minutes) a week of vigorous-intensity aerobic physical activity, or an equivalent combination of moderate- and vigorous-intensity aerobic activity. Aerobic activity should be performed in episodes of at least  10 minutes, and preferably, it should be spread throughout the week.  Behavioral modification strategies: increasing lean protein intake, no meal skipping, meal planning , increase water intake, better snacking choices, planning for success, increasing vegetables, avoiding temptations, keep healthy foods in the home, weigh protein portions, measure portion sizes, and work on smaller portions.  Alberto has agreed to follow-up with our clinic in 4 weeks.     Objective:   VITALS: Per patient if applicable, see vitals. GENERAL: Alert and in no acute distress. CARDIOPULMONARY: No increased WOB. Speaking in clear sentences.  PSYCH: Pleasant and cooperative. Speech normal rate and rhythm. Affect is appropriate. Insight and judgement are appropriate. Attention is focused, linear, and appropriate.  NEURO: Oriented as arrived to appointment on time with no prompting.   Attestation Statements:    This was prepared with the assistance of Engineer, Civil (consulting).  Occasional wrong-word or sound-a-like substitutions may have occurred due to the inherent limitations of voice recognition   Clayborne Daring, DO

## 2024-08-23 ENCOUNTER — Ambulatory Visit: Admitting: Bariatrics

## 2024-08-23 ENCOUNTER — Encounter: Payer: Self-pay | Admitting: Bariatrics

## 2024-08-23 VITALS — BP 121/68 | HR 74 | Ht 61.5 in | Wt 167.0 lb

## 2024-08-23 DIAGNOSIS — Z6831 Body mass index (BMI) 31.0-31.9, adult: Secondary | ICD-10-CM

## 2024-08-23 DIAGNOSIS — E785 Hyperlipidemia, unspecified: Secondary | ICD-10-CM

## 2024-08-23 DIAGNOSIS — E66811 Obesity, class 1: Secondary | ICD-10-CM

## 2024-08-23 DIAGNOSIS — E782 Mixed hyperlipidemia: Secondary | ICD-10-CM

## 2024-08-23 DIAGNOSIS — E669 Obesity, unspecified: Secondary | ICD-10-CM

## 2024-08-23 NOTE — Progress Notes (Signed)
 "                                                                                                             WEIGHT SUMMARY AND BIOMETRICS  Weight Lost Since Last Visit: 0  Weight Gained Since Last Visit: 2lb   Vitals BP: 121/68 Pulse Rate: 74 SpO2: 100 %   Anthropometric Measurements Height: 5' 1.5 (1.562 m) Weight: 167 lb (75.8 kg) BMI (Calculated): 31.05 Weight at Last Visit: 165lb Weight Lost Since Last Visit: 0 Weight Gained Since Last Visit: 2lb Starting Weight: 174lb Total Weight Loss (lbs): 11 lb (4.99 kg)   Body Composition  Body Fat %: 35.6 % Fat Mass (lbs): 59.6 lbs Muscle Mass (lbs): 102.4 lbs Total Body Water (lbs): 67.2 lbs Visceral Fat Rating : 8   Other Clinical Data Fasting: no Labs: no Today's Visit #: 14 Starting Date: 08/31/22    OBESITY Jessica Richmond is here to discuss her progress with her obesity treatment plan along with follow-up of her obesity related diagnoses.    Nutrition Plan: the Category 2 plan - 75% adherence.  Current exercise: walking and weightlifting  Interim History:  She is up 2 lbs since her last visit that was over the holiday. She states that she has been walking and doing the weight training.  Her according to the bioimpedance machine, her muscle mass is down slightly and her body water is up slightly. Eating all of the food on the plan., Protein intake is as prescribed, Is not skipping meals, Journaling consistently., and Water intake is adequate.   Pharmacotherapy: Jessica Richmond is not on any anti-obesity medications.  Hunger is moderately controlled.  Cravings are moderately controlled.  Assessment/Plan:   Hyperlipidemia LDL is not at goal. Medication(s):none Cardiovascular risk factors: obesity (BMI >= 30 kg/m2)  Lab Results  Component Value Date   CHOL 255 (H) 02/04/2024   HDL 50 02/04/2024   LDLCALC 178 (H) 02/04/2024   TRIG 146 02/04/2024   CHOLHDL 5.1 (H) 02/04/2024   Lab Results  Component Value Date    ALT 16 02/04/2024   AST 19 02/04/2024   ALKPHOS 90 02/04/2024   BILITOT 0.4 02/04/2024   The 10-year ASCVD risk score (Arnett DK, et al., 2019) is: 2.3%   Values used to calculate the score:     Age: 49 years     Clinically relevant sex: Female     Is Non-Hispanic African American: No     Diabetic: No     Tobacco smoker: No     Systolic Blood Pressure: 121 mmHg     Is BP treated: Yes     HDL Cholesterol: 50 mg/dL     Total Cholesterol: 255 mg/dL  Plan:  Will continue to journal.  Will walk on the days that she does not do weight training.  Will journal her meals for calorie and protein consistently. She will have a small smoothie daily to help with sweet cravings. Will avoid all trans fats.  Will read labels Will minimize saturated fats except the following: low  fat meats in moderation, diary, and limited dark chocolate.    Generalized Obesity: Current BMI BMI (Calculated): 31.05   Pharmacotherapy Plan No anti-obesity medications.   Jessica Richmond is currently in the action stage of change. As such, her goal is to continue with weight loss efforts.  She has agreed to the Category 2 plan.  Exercise goals: For substantial health benefits, adults should do at least 150 minutes (2 hours and 30 minutes) a week of moderate-intensity, or 75 minutes (1 hour and 15 minutes) a week of vigorous-intensity aerobic physical activity, or an equivalent combination of moderate- and vigorous-intensity aerobic activity. Aerobic activity should be performed in episodes of at least 10 minutes, and preferably, it should be spread throughout the week. She will continue to do both cardio and resistance.  She will get back to her paddling later on in the month and will be more consistent in the spring.  Behavioral modification strategies: increasing lean protein intake, decreasing simple carbohydrates , no meal skipping, meal planning , increase water intake, better snacking choices, planning for success,  increasing vegetables, increasing fiber rich foods, decrease snacking , avoiding temptations, keep healthy foods in the home, increase frequency of journaling, weigh protein portions, and measure portion sizes.  Jessica Richmond has agreed to follow-up with our clinic in 4 weeks for fasting labs and an indirect calorimetry.    Objective:   VITALS: Per patient if applicable, see vitals. GENERAL: Alert and in no acute distress. CARDIOPULMONARY: No increased WOB. Speaking in clear sentences.  PSYCH: Pleasant and cooperative. Speech normal rate and rhythm. Affect is appropriate. Insight and judgement are appropriate. Attention is focused, linear, and appropriate.  NEURO: Oriented as arrived to appointment on time with no prompting.   Attestation Statements:   This was prepared with the assistance of Engineer, Civil (consulting).  Occasional wrong-word or sound-a-like substitutions may have occurred due to the inherent limitations of voice recognition.   Clayborne Daring, DO    "

## 2024-08-25 ENCOUNTER — Encounter: Payer: Self-pay | Admitting: Family Medicine

## 2024-08-25 DIAGNOSIS — Z79899 Other long term (current) drug therapy: Secondary | ICD-10-CM

## 2024-08-25 DIAGNOSIS — I10 Essential (primary) hypertension: Secondary | ICD-10-CM

## 2024-08-25 MED ORDER — HYDROCHLOROTHIAZIDE 25 MG PO TABS: 25.0000 mg | ORAL_TABLET | Freq: Every day | ORAL | 0 refills | Status: DC

## 2024-08-25 NOTE — Telephone Encounter (Signed)
 Called patient. She has been scheduled for a follow up and advised the medication was sent to the pharmacy.

## 2024-09-08 ENCOUNTER — Encounter: Payer: Self-pay | Admitting: Family Medicine

## 2024-09-08 ENCOUNTER — Ambulatory Visit: Admitting: Family Medicine

## 2024-09-08 VITALS — BP 117/78 | HR 89 | Ht 61.5 in | Wt 172.0 lb

## 2024-09-08 DIAGNOSIS — Z79899 Other long term (current) drug therapy: Secondary | ICD-10-CM | POA: Diagnosis not present

## 2024-09-08 DIAGNOSIS — R6889 Other general symptoms and signs: Secondary | ICD-10-CM

## 2024-09-08 DIAGNOSIS — Z23 Encounter for immunization: Secondary | ICD-10-CM | POA: Diagnosis not present

## 2024-09-08 DIAGNOSIS — E782 Mixed hyperlipidemia: Secondary | ICD-10-CM | POA: Diagnosis not present

## 2024-09-08 DIAGNOSIS — I10 Essential (primary) hypertension: Secondary | ICD-10-CM

## 2024-09-08 MED ORDER — HYDROCHLOROTHIAZIDE 25 MG PO TABS: 25.0000 mg | ORAL_TABLET | Freq: Every day | ORAL | 3 refills | Status: AC

## 2024-09-08 NOTE — Progress Notes (Signed)
 " Jessica Richmond - 49 y.o. female MRN [id number]  Date of birth: [birth year]  Subjective Chief Complaint  Patient presents with   Hypertension    HPI Jessica Richmond is a 49 y.o. female here today for follow up visit.   History of HTN and doing well with hydrochlorothiazide  at current strength.  She is not experiencing side effects from this.  She is watching salt intake.  Seeing healthy weight and wellness. She denies chest pain, shortness of breath, palpitations, headache or vision changes.   ROS:  A comprehensive ROS was completed and negative except as noted per HPI  Allergies[1]  Past Medical History:  Diagnosis Date   Abnormal Pap smear of cervix    ASCUS retested neg   Allergic rhinitis    Allergy     Arthritis 2021   Confirmed in foot via xray and visit to podiatrist   Fatty liver    Heart murmur    Hemangioma    LIVER   High blood pressure    High cholesterol    History of kidney stones    CYST   Lactose intolerance    Multiple food allergies    wheat, Milk, shrimp   Pneumonia    Sleep apnea    Vitamin B 12 deficiency     Past Surgical History:  Procedure Laterality Date   CHOLECYSTECTOMY N/A 10/18/2019   Procedure: LAPAROSCOPIC CHOLECYSTECTOMY;  Surgeon: Tanda Locus, MD;  Location: [facility];  Service: General;  Laterality: N/A;   HIP ARTHROSCOPY Left    torn labrumin   WISDOM TOOTH EXTRACTION      Social History   Socioeconomic History   Marital status: Single    Spouse name: Not on file   Number of children: 0   Years of education: Not on file   Highest education level: Master's degree (e.g., MA, MS, MEng, MEd, MSW, MBA)  Occupational History   Occupation: arts Chartered Certified Accountant: [employer]  Tobacco Use   Smoking status: Never   Smokeless tobacco: Never  Vaping Use   Vaping status: Never Used  Substance and Sexual Activity   Alcohol use: Yes    Alcohol/week: 1.0 standard drink of alcohol    Types: 1 Standard drinks or equivalent per week     Comment: social   Drug use: No   Sexual activity: Not Currently    Partners: Male    Birth control/protection: None, I.U.D.  Other Topics Concern   Not on file  Social History Narrative   Not on file   Social Drivers of Health   Tobacco Use: Low Risk (09/08/2024)   Patient History    Smoking Tobacco Use: Never    Smokeless Tobacco Use: Never    Passive Exposure: Not on file  Financial Resource Strain: Low Risk (02/04/2024)   Overall Financial Resource Strain (CARDIA)    Difficulty of Paying Living Expenses: Not very hard  Food Insecurity: No Food Insecurity (02/04/2024)   Epic    Worried About Programme Researcher, Broadcasting/film/video in the Last Year: Never true    Ran Out of Food in the Last Year: Never true  Transportation Needs: No Transportation Needs (02/04/2024)   Epic    Lack of Transportation (Medical): No    Lack of Transportation (Non-Medical): No  Physical Activity: Sufficiently Active (02/04/2024)   Exercise Vital Sign    Days of Exercise per Week: 5 days    Minutes of Exercise per Session: 40 min  Stress: No Stress Concern Present (  02/04/2024)   [employer] of Occupational Health - Occupational Stress Questionnaire    Feeling of Stress: Not at all  Social Connections: Socially Isolated (02/04/2024)   Social Connection and Isolation Panel    Frequency of Communication with Friends and Family: Once a week    Frequency of Social Gatherings with Friends and Family: Once a week    Attends Religious Services: Never    Database Administrator or Organizations: No    Attends Engineer, Structural: Not on file    Marital Status: Never married  Depression (PHQ2-9): Low Risk (01/05/2024)   Depression (PHQ2-9)    PHQ-2 Score: 0  Alcohol Screen: Low Risk (02/04/2024)   Alcohol Screen    Last Alcohol Screening Score (AUDIT): 1  Housing: Low Risk (02/04/2024)   Epic    Unable to Pay for Housing in the Last Year: No    Number of Times Moved in the Last Year: 0    Homeless in the Last  Year: No  Utilities: Not At Risk (02/04/2024)   Epic    Threatened with loss of utilities: No  Health Literacy: Adequate Health Literacy (02/04/2024)   B1300 Health Literacy    Frequency of need for help with medical instructions: Never    Family History  Problem Relation Age of Onset   Heart disease Mother 33       A fib   Fibrocystic breast disease Mother        partial mastectomy   Hypertension Father    Diabetes Father    Diverticulosis Father    Depression Father    High Cholesterol Father    Anxiety disorder Father    Sleep apnea Father    Arthritis Father    Crohn's disease Sister    Ovarian cysts Sister    Raynaud syndrome Sister    Depression Sister    Uterine cancer Other        maternal great grandmother   Colon cancer Neg Hx    Esophageal cancer Neg Hx    Rectal cancer Neg Hx    Stomach cancer Neg Hx     Health Maintenance  Topic Date Due   HIV Screening  Never done   Hepatitis C Screening  Never done   Hepatitis B Vaccines 19-59 Average Risk (1 of 3 - 19+ 3-dose series) Never done   Zoster Vaccines- Shingrix (1 of 2) Never done   Pneumococcal Vaccine: 50+ Years (1 of 1 - PCV) Never done   Influenza Vaccine  03/19/2024   COVID-19 Vaccine (4 - 2025-26 season) 04/19/2024   Mammogram  12/30/2025   Colonoscopy  10/20/2026   Cervical Cancer Screening (HPV/Pap Cotest)  01/04/2029   DTaP/Tdap/Td (3 - Td or Tdap) 02/03/2034   HPV VACCINES (No Doses Required) Completed   Meningococcal B Vaccine  Aged Out     ----------------------------------------------------------------------------------------------------------------------------------------------------------------------------------------------------------------- Physical Exam BP (!) 145/82 (BP Location: Left Arm, Patient Position: Sitting, Cuff Size: Normal)   Pulse 89   Ht 5' 1.5 (1.562 m)   Wt 172 lb (78 kg)   SpO2 97%   BMI 31.97 kg/m   Physical Exam Constitutional:      Appearance: Normal  appearance.  Eyes:     General: No scleral icterus. Cardiovascular:     Rate and Rhythm: Normal rate and regular rhythm.  Pulmonary:     Effort: Pulmonary effort is normal.     Breath sounds: Normal breath sounds.  Neurological:     Mental Status:  She is alert.  Psychiatric:        Mood and Affect: Mood normal.        Behavior: Behavior normal.     ------------------------------------------------------------------------------------------------------------------------------------------------------------------------------------------------------------------- Assessment and Plan  Primary hypertension Blood pressure is well-controlled at this time.  Recommend continuation of hydrochlorothiazide  at current strength.  Follow-up in about 6 months for annual exam.  Mixed hyperlipidemia She has upcoming visit with [facility].  Will request that they add lipid panel on to labs she is having if not planning to do this already.    Meds ordered this encounter  Medications   hydrochlorothiazide  (HYDRODIURIL ) 25 MG tablet    Sig: Take 1 tablet (25 mg total) by mouth daily.    Dispense:  90 tablet    Refill:  3    Return in about 6 months (around 03/08/2025) for Annual Exam.        [1]  Allergies Allergen Reactions   Keflex [Cephalexin] Hives   Indomethacin Nausea Only, Other (See Comments) and Nausea And Vomiting    Dizziness   Milk-Related Compounds    Shrimp [Shellfish Allergy ]    Wheat    "

## 2024-09-08 NOTE — Assessment & Plan Note (Signed)
Blood pressure is well-controlled at this time.  Recommend continuation of hydrochlorothiazide at current strength.  Follow-up in about 6 months for annual exam.

## 2024-09-08 NOTE — Assessment & Plan Note (Signed)
 She has upcoming visit with [facility].  Will request that they add lipid panel on to labs she is having if not planning to do this already.

## 2024-09-20 ENCOUNTER — Ambulatory Visit: Admitting: Bariatrics

## 2024-09-27 ENCOUNTER — Ambulatory Visit: Admitting: Bariatrics

## 2024-10-18 ENCOUNTER — Ambulatory Visit: Admitting: Obstetrics & Gynecology

## 2025-03-08 ENCOUNTER — Encounter: Admitting: Family Medicine
